# Patient Record
Sex: Female | Born: 1970
Health system: Southern US, Community
[De-identification: ages and names within clinical notes are randomized; demographics above are authoritative.]

## PROBLEM LIST (undated history)

## (undated) DIAGNOSIS — I639 Cerebral infarction, unspecified: Secondary | ICD-10-CM

## (undated) DIAGNOSIS — I10 Essential (primary) hypertension: Secondary | ICD-10-CM

## (undated) DIAGNOSIS — R569 Unspecified convulsions: Secondary | ICD-10-CM

## (undated) HISTORY — PX: OTHER SURGICAL HISTORY: SHX169

## (undated) HISTORY — PX: TONSILLECTOMY: SUR1361

---

## 2009-07-17 ENCOUNTER — Ambulatory Visit: Payer: Self-pay | Admitting: Diagnostic Radiology

## 2009-07-17 ENCOUNTER — Emergency Department (HOSPITAL_BASED_OUTPATIENT_CLINIC_OR_DEPARTMENT_OTHER): Admission: EM | Admit: 2009-07-17 | Discharge: 2009-07-17 | Payer: Self-pay | Admitting: Emergency Medicine

## 2009-07-19 ENCOUNTER — Emergency Department (HOSPITAL_BASED_OUTPATIENT_CLINIC_OR_DEPARTMENT_OTHER): Admission: EM | Admit: 2009-07-19 | Discharge: 2009-07-19 | Payer: Self-pay | Admitting: Emergency Medicine

## 2009-07-25 ENCOUNTER — Emergency Department (HOSPITAL_BASED_OUTPATIENT_CLINIC_OR_DEPARTMENT_OTHER): Admission: EM | Admit: 2009-07-25 | Discharge: 2009-07-26 | Payer: Self-pay | Admitting: Emergency Medicine

## 2010-07-17 ENCOUNTER — Emergency Department (HOSPITAL_BASED_OUTPATIENT_CLINIC_OR_DEPARTMENT_OTHER)
Admission: EM | Admit: 2010-07-17 | Discharge: 2010-07-17 | Payer: Self-pay | Source: Home / Self Care | Admitting: Emergency Medicine

## 2010-09-13 LAB — BASIC METABOLIC PANEL
CO2: 30 mEq/L (ref 19–32)
Creatinine, Ser: 0.6 mg/dL (ref 0.4–1.2)
Potassium: 3 mEq/L — ABNORMAL LOW (ref 3.5–5.1)
Sodium: 144 mEq/L (ref 135–145)

## 2010-09-13 LAB — GLUCOSE, CAPILLARY: Glucose-Capillary: 112 mg/dL — ABNORMAL HIGH (ref 70–99)

## 2011-09-14 ENCOUNTER — Encounter (HOSPITAL_BASED_OUTPATIENT_CLINIC_OR_DEPARTMENT_OTHER): Payer: Self-pay | Admitting: Emergency Medicine

## 2011-09-14 ENCOUNTER — Emergency Department (INDEPENDENT_AMBULATORY_CARE_PROVIDER_SITE_OTHER): Payer: No Typology Code available for payment source

## 2011-09-14 ENCOUNTER — Emergency Department (HOSPITAL_BASED_OUTPATIENT_CLINIC_OR_DEPARTMENT_OTHER)
Admission: EM | Admit: 2011-09-14 | Discharge: 2011-09-14 | Disposition: A | Discharge status: Home / Self Care | Payer: No Typology Code available for payment source | Attending: Emergency Medicine | Admitting: Emergency Medicine

## 2011-09-14 DIAGNOSIS — I10 Essential (primary) hypertension: Secondary | ICD-10-CM | POA: Insufficient documentation

## 2011-09-14 DIAGNOSIS — M549 Dorsalgia, unspecified: Secondary | ICD-10-CM

## 2011-09-14 DIAGNOSIS — S239XXA Sprain of unspecified parts of thorax, initial encounter: Secondary | ICD-10-CM | POA: Insufficient documentation

## 2011-09-14 DIAGNOSIS — E119 Type 2 diabetes mellitus without complications: Secondary | ICD-10-CM | POA: Insufficient documentation

## 2011-09-14 DIAGNOSIS — M546 Pain in thoracic spine: Secondary | ICD-10-CM | POA: Insufficient documentation

## 2011-09-14 DIAGNOSIS — IMO0002 Reserved for concepts with insufficient information to code with codable children: Secondary | ICD-10-CM

## 2011-09-14 HISTORY — DX: Essential (primary) hypertension: I10

## 2011-09-14 MED ORDER — CYCLOBENZAPRINE HCL 10 MG PO TABS: 10.0000 mg | ORAL_TABLET | Freq: Two times a day (BID) | ORAL | Status: AC | PRN
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
## 2011-09-14 NOTE — Discharge Instructions (Signed)
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
## 2012-03-04 ENCOUNTER — Emergency Department (HOSPITAL_BASED_OUTPATIENT_CLINIC_OR_DEPARTMENT_OTHER)
Admission: EM | Admit: 2012-03-04 | Discharge: 2012-03-04 | Disposition: A | Discharge status: Home / Self Care | Payer: Medicaid Other | Attending: Emergency Medicine | Admitting: Emergency Medicine

## 2012-03-04 ENCOUNTER — Encounter (HOSPITAL_BASED_OUTPATIENT_CLINIC_OR_DEPARTMENT_OTHER): Payer: Self-pay | Admitting: Emergency Medicine

## 2012-03-04 DIAGNOSIS — I10 Essential (primary) hypertension: Secondary | ICD-10-CM | POA: Insufficient documentation

## 2012-03-04 DIAGNOSIS — L02415 Cutaneous abscess of right lower limb: Secondary | ICD-10-CM

## 2012-03-04 DIAGNOSIS — E119 Type 2 diabetes mellitus without complications: Secondary | ICD-10-CM | POA: Insufficient documentation

## 2012-03-04 DIAGNOSIS — L02419 Cutaneous abscess of limb, unspecified: Secondary | ICD-10-CM | POA: Insufficient documentation

## 2012-03-04 MED ORDER — SULFAMETHOXAZOLE-TRIMETHOPRIM 800-160 MG PO TABS: 2.0000 | ORAL_TABLET | Freq: Two times a day (BID) | ORAL | Status: AC

## 2012-03-04 MED ORDER — CEPHALEXIN 500 MG PO CAPS: 500.0000 mg | ORAL_CAPSULE | Freq: Four times a day (QID) | ORAL | Status: AC

## 2012-03-04 NOTE — ED Provider Notes (Signed)
History     CSN: [id number]  Arrival date & time 03/04/12  0919   First MD Initiated Contact with Patient 03/04/12 1053      Chief Complaint  Patient presents with  . Cyst    (Consider location/radiation/quality/duration/timing/severity/associated sxs/prior treatment) The history is provided by the patient and medical records.    Sharon May is a 41 y.o. female presents to the emergency department complaining of R hip "lump" and pain.  The onset of the symptoms was  gradual starting 6 days ago.  The patient has associated swelling.  The symptoms have been  persistent, gradually worsened.  nothing makes the symptoms worse and nothing makes symptoms better.  The patient denies fever, chills, headache, chest pain, abdominal pain, nausea vomiting.  Pt states she has Hx of abscess with successful treatment using doxycyline.  .  The last one was approximately 1 year ago.  She states she used to get them frequently.  There has been no draining.  Pt states it is very sore and tender.  She has not tried anything.     Past Medical History  Diagnosis Date  . Hypertension   . Diabetes mellitus     Past Surgical History  Procedure Date  . Cyst removed   . Tonsillectomy     No family history on file.  History  Substance Use Topics  . Smoking status: Never Smoker   . Smokeless tobacco: Not on file  . Alcohol Use: No    OB History    Grav Para Term Preterm Abortions TAB SAB Ect Mult Living                  Review of Systems  Constitutional: Negative for fever, diaphoresis, appetite change, fatigue and unexpected weight change.  HENT: Negative for mouth sores and neck stiffness.   Eyes: Negative for visual disturbance.  Respiratory: Negative for cough, chest tightness, shortness of breath and wheezing.   Cardiovascular: Negative for chest pain.  Gastrointestinal: Negative for nausea, vomiting, abdominal pain, diarrhea and constipation.  Genitourinary: Negative for dysuria,  urgency, frequency and hematuria.  Skin: Negative for rash.       Lump on R hpi  Neurological: Negative for syncope, light-headedness and headaches.  Psychiatric/Behavioral: Negative for disturbed wake/sleep cycle. The patient is not nervous/anxious.     Allergies  Morphine and related  Home Medications   Current Outpatient Rx  Name Route Sig Dispense Refill  . METFORMIN HCL 500 MG PO TABS Oral Take 500 mg by mouth 2 (two) times daily with a meal.    . ATENOLOL 50 MG PO TABS Oral Take 50 mg by mouth daily.    . CEPHALEXIN 500 MG PO CAPS Oral Take 1 capsule (500 mg total) by mouth 4 (four) times daily. 40 capsule 0  . CLONIDINE HCL 0.2 MG PO TABS Oral Take 0.2 mg by mouth 2 (two) times daily.    Marland Kitchen HYDROCHLOROTHIAZIDE 25 MG PO TABS Oral Take 25 mg by mouth daily.    Marland Kitchen LISINOPRIL 20 MG PO TABS Oral Take 20 mg by mouth daily.    . SULFAMETHOXAZOLE-TRIMETHOPRIM 800-160 MG PO TABS Oral Take 2 tablets by mouth every 12 (twelve) hours. 40 tablet 0    BP 161/88  Pulse 87  Temp 97.9 F (36.6 C) (Oral)  Resp 17  SpO2 100%  Physical Exam  Nursing note and vitals reviewed. Constitutional: She appears well-developed and well-nourished. No distress.  HENT:  Head: Normocephalic and atraumatic.  Mouth/Throat:  Oropharynx is clear and moist. No oropharyngeal exudate.  Eyes: Conjunctivae are normal. No scleral icterus.  Neck: Normal range of motion. Neck supple.  Cardiovascular: Normal rate, regular rhythm, normal heart sounds and intact distal pulses.  Exam reveals no gallop and no friction rub.   No murmur heard. Pulmonary/Chest: Effort normal and breath sounds normal. No respiratory distress. She has no wheezes.  Abdominal: Soft. Bowel sounds are normal. She exhibits no mass. There is no tenderness. There is no rebound and no guarding.  Musculoskeletal: Normal range of motion. She exhibits no edema.  Neurological: She is alert.       Speech is clear and goal oriented Moves extremities  without ataxia  Skin: Skin is warm and dry. She is not diaphoretic.       Palpation of deep tissue swelling with discrete borders. No erythema or induration of the skin.   No increased warmth.    Psychiatric: She has a normal mood and affect.    ED Course  Procedures (including critical care time)  Labs Reviewed - No data to display No results found.   1. Abscess of hip, right       MDM  Mahealani Sulak presents with lump to the right hip.  History of abscess, likely deep tissue abscess. Patient is alert, oriented, nontoxic, nonseptic appearing. She is afebrile without systemic signs or symptoms. Masses to deep to open. Discharge home with Bactrim and Keflex. I've instructed her to followup with her primary care physician in the morning. If she is unable to make an appointment with that provider she is to call general surgery for evaluation.  I have also discussed reasons to return immediately to the ER.  Patient expresses understanding and agrees with plan.  1. Medications: bactrim, keflex 2. Treatment: Take medications as prescribed, warm compresses to the area 3. Follow Up: Primary care physician tomorrow morning. If unable to get an appointment call surgery at the number listed above.  Followup with your doctor in 48-72 hours. You may return to the emergency department if you have  a fever that persists greater than 101 or your abscess appears to become infected (growing surrounding redness and warmth). Do not operate any heavy machinery while on pain medications. Do not consume alcohol on these medications either.  Abscess An abscess (boil or furuncle) is an infected area that contains a collection of pus.  SYMPTOMS Signs and symptoms of an abscess include pain, tenderness, redness, or hardness. You may feel a moveable soft area under your skin. An abscess can occur anywhere in the body.  TREATMENT  A surgical cut (incision) may be made over your abscess to drain the pus. Gauze may  be packed into the space or a drain may be looped through the abscess cavity (pocket). This provides a drain that will allow the cavity to heal from the inside outwards. The abscess may be painful for a few days, but should feel much better if it was drained.  Your abscess, if seen early, may not have localized and may not have been drained. If not, another appointment may be required if it does not get better on its own or with medications. HOME CARE INSTRUCTIONS   Only take over-the-counter or prescription medicines for pain, discomfort, or fever as directed by your caregiver.   Take your antibiotics as directed if they were prescribed. Finish them even if you start to feel better.   Keep the skin and clothes clean around your abscess.   If  the abscess was drained, you will need to use gauze dressing to collect any draining pus. Dressings will typically need to be changed 3 or more times a day.   The infection may spread by skin contact with others. Avoid skin contact as much as possible.   Practice good hygiene. This includes regular hand washing, cover any draining skin lesions, and do not share personal care items.   If you participate in sports, do not share athletic equipment, towels, whirlpools, or personal care items. Shower after every practice or tournament.   If a draining area cannot be adequately covered:   Do not participate in sports.   Children should not participate in day care until the wound has healed or drainage stops.   If your caregiver has given you a follow-up appointment, it is very important to keep that appointment. Not keeping the appointment could result in a much worse infection, chronic or permanent injury, pain, and disability. If there is any problem keeping the appointment, you must call back to this facility for assistance.  SEEK MEDICAL CARE IF:   You develop increased pain, swelling, redness, drainage, or bleeding in the wound site.   You develop  signs of generalized infection including muscle aches, chills, fever, or a general ill feeling.   You have an oral temperature above 102 F (38.9 C).  MAKE SURE YOU:   Understand these instructions.   Will watch your condition.   Will get help right away if you are not doing well or get worse.  Document Released: 03/23/2005 Document Revised: 02/23/2011 Document Reviewed: 01/15/2008 [facility] Patient Information 2012 [place], [place].       Dahlia Client Jonanthony Nahar, PA-C 03/04/12 1254

## 2012-03-04 NOTE — ED Notes (Signed)
Pt has nodule on right hip that she noticed 2-3 days ago.  Pt denies fever, or insect bite.  Palpated nodule, painful to touch.  Pt relates it has gotten larger.

## 2012-03-10 NOTE — ED Provider Notes (Signed)
Medical screening examination/treatment/procedure(s) were performed by non-physician practitioner and as supervising physician I was immediately available for consultation/collaboration.  Jabarie Pop, MD 03/10/12 1433 

## 2012-09-12 ENCOUNTER — Emergency Department (HOSPITAL_BASED_OUTPATIENT_CLINIC_OR_DEPARTMENT_OTHER)
Admission: EM | Admit: 2012-09-12 | Discharge: 2012-09-12 | Disposition: A | Discharge status: Home / Self Care | Payer: Medicaid Other | Attending: Emergency Medicine | Admitting: Emergency Medicine

## 2012-09-12 ENCOUNTER — Encounter (HOSPITAL_BASED_OUTPATIENT_CLINIC_OR_DEPARTMENT_OTHER): Payer: Self-pay

## 2012-09-12 DIAGNOSIS — Z79899 Other long term (current) drug therapy: Secondary | ICD-10-CM | POA: Insufficient documentation

## 2012-09-12 DIAGNOSIS — I10 Essential (primary) hypertension: Secondary | ICD-10-CM | POA: Insufficient documentation

## 2012-09-12 DIAGNOSIS — IMO0002 Reserved for concepts with insufficient information to code with codable children: Secondary | ICD-10-CM | POA: Insufficient documentation

## 2012-09-12 DIAGNOSIS — E119 Type 2 diabetes mellitus without complications: Secondary | ICD-10-CM | POA: Insufficient documentation

## 2012-09-12 DIAGNOSIS — L02411 Cutaneous abscess of right axilla: Secondary | ICD-10-CM

## 2012-09-12 LAB — GLUCOSE, CAPILLARY: Glucose-Capillary: 340 mg/dL — ABNORMAL HIGH (ref 70–99)

## 2012-09-12 MED ORDER — SULFAMETHOXAZOLE-TRIMETHOPRIM 800-160 MG PO TABS: 1.0000 | ORAL_TABLET | Freq: Two times a day (BID) | ORAL | Status: AC

## 2012-09-12 MED ORDER — HYDROCODONE-ACETAMINOPHEN 5-325 MG PO TABS: 2.0000 | ORAL_TABLET | ORAL | Status: DC | PRN

## 2012-09-12 NOTE — ED Provider Notes (Signed)
History     CSN: [id number]  Arrival date & time 09/12/12  1536   First MD Initiated Contact with Patient 09/12/12 1548      Chief Complaint  Patient presents with  . Abscess    (Consider location/radiation/quality/duration/timing/severity/associated sxs/prior treatment) Patient is a 42 y.o. female presenting with abscess. The history is provided by the patient.  Abscess Location:  Shoulder/arm Shoulder/arm abscess location:  R axilla Abscess quality: draining, induration, painful and redness   Red streaking: no   Progression:  Worsening Pain details:    Quality:  No pain   Timing:  Constant Pt complains of an abscess to right arm  Past Medical History  Diagnosis Date  . Hypertension   . Diabetes mellitus     Past Surgical History  Procedure Laterality Date  . Cyst removed    . Tonsillectomy      No family history on file.  History  Substance Use Topics  . Smoking status: Never Smoker   . Smokeless tobacco: Not on file  . Alcohol Use: No    OB History   Grav Para Term Preterm Abortions TAB SAB Ect Mult Living                  Review of Systems  Skin: Positive for wound.  All other systems reviewed and are negative.    Allergies  Morphine and related  Home Medications   Current Outpatient Rx  Name  Route  Sig  Dispense  Refill  . atenolol (TENORMIN) 50 MG tablet   Oral   Take 50 mg by mouth daily.         . cloNIDine (CATAPRES) 0.2 MG tablet   Oral   Take 0.2 mg by mouth 2 (two) times daily.         . hydrochlorothiazide (HYDRODIURIL) 25 MG tablet   Oral   Take 25 mg by mouth daily.         Marland Kitchen lisinopril (PRINIVIL,ZESTRIL) 20 MG tablet   Oral   Take 20 mg by mouth daily.         . metFORMIN (GLUCOPHAGE) 500 MG tablet   Oral   Take 500 mg by mouth 2 (two) times daily with a meal.           BP 159/80  Pulse 88  Temp(Src) 98.6 F (37 C) (Oral)  Resp 18  Ht 5\' 4"  (1.626 m)  Wt 170 lb (77.111 kg)  BMI 29.17 kg/m2   SpO2 100%  Physical Exam  Nursing note and vitals reviewed. Constitutional: She is oriented to person, place, and time. She appears well-developed and well-nourished.  HENT:  Head: Normocephalic.  Right Ear: External ear normal.  Eyes: Pupils are equal, round, and reactive to light.  Neck: Normal range of motion.  Cardiovascular: Normal rate.   Pulmonary/Chest: Effort normal.  Abdominal: Soft.  Musculoskeletal: Normal range of motion.  Neurological: She is alert and oriented to person, place, and time.  Swollen right axilla  Skin: Skin is warm and dry.    ED Course  INCISION AND DRAINAGE Date/Time: 09/12/2012 5:42 PM Performed by: Cheron Schaumann K Authorized by: Cheron Schaumann K Risks and benefits: risks, benefits and alternatives were discussed Consent given by: patient Patient understanding: patient states understanding of the procedure being performed Required items: required blood products, implants, devices, and special equipment available Patient identity confirmed: verbally with patient Time out: Immediately prior to procedure a "time out" was called to verify the correct patient,  procedure, equipment, support staff and site/side marked as required. Type: abscess Body area: upper extremity Location details: right arm Anesthesia: local infiltration Local anesthetic: lidocaine 2% without epinephrine Scalpel size: 11 Incision type: elliptical and single straight Drainage: purulent Wound treatment: wound left open Comments: I also i and d smaller pointing abscess,   Areas appear to communicate,     (including critical care time)  Labs Reviewed  GLUCOSE, CAPILLARY - Abnormal; Notable for the following:    Glucose-Capillary 340 (*)    All other components within normal limits   No results found.   1. Abscess of right axilla       MDM  Bactrim and hydrocodone         Elson Areas, PA-C 09/12/12 1744

## 2012-09-12 NOTE — ED Notes (Signed)
PA at bedside for i&d

## 2012-09-12 NOTE — ED Notes (Signed)
Abscess to right axilla x 1 week

## 2012-09-13 NOTE — ED Provider Notes (Signed)
  Medical screening examination/treatment/procedure(s) were performed by non-physician practitioner and as supervising physician I was immediately available for consultation/collaboration.    Gerhard Munch, MD 09/13/12 [phone]

## 2013-11-03 ENCOUNTER — Encounter (HOSPITAL_BASED_OUTPATIENT_CLINIC_OR_DEPARTMENT_OTHER): Payer: Self-pay | Admitting: Emergency Medicine

## 2013-11-03 ENCOUNTER — Emergency Department (HOSPITAL_BASED_OUTPATIENT_CLINIC_OR_DEPARTMENT_OTHER)
Admission: EM | Admit: 2013-11-03 | Discharge: 2013-11-04 | Disposition: A | Discharge status: Home / Self Care | Payer: Medicaid Other | Attending: Emergency Medicine | Admitting: Emergency Medicine

## 2013-11-03 DIAGNOSIS — M545 Low back pain, unspecified: Secondary | ICD-10-CM

## 2013-11-03 DIAGNOSIS — I10 Essential (primary) hypertension: Secondary | ICD-10-CM

## 2013-11-03 DIAGNOSIS — E119 Type 2 diabetes mellitus without complications: Secondary | ICD-10-CM | POA: Insufficient documentation

## 2013-11-03 DIAGNOSIS — R52 Pain, unspecified: Secondary | ICD-10-CM | POA: Insufficient documentation

## 2013-11-03 DIAGNOSIS — Z79899 Other long term (current) drug therapy: Secondary | ICD-10-CM | POA: Insufficient documentation

## 2013-11-03 NOTE — ED Provider Notes (Addendum)
CSN: [id number]     Arrival date & time 11/03/13  2307 History  This chart was scribed for Hanley Seamen, MD by Nicholos Johns, ED scribe. This patient was seen in room MH09/MH09 and the patient's care was started at 11:53 PM.   Chief Complaint  Patient presents with  . Back Pain    The history is provided by the patient. No language interpreter was used.   HPI Comments: Deicy Hollar is a 43 y.o. female who presents to the Emergency Department complaining of gradually worsening right lower back pain, radiating to right buttock, onset about a week ago. Rates pain 10/10. Pain worse with ambulation and movement. There is no associated numbness or weakness. Pt was seen by her PCP 3 days ago for this pain and given muscle relaxant and an injection. Reports pain has since worsened. Pt is on Clonidine for BP but has not taken her evening dose yet. Denies HA, chest pain, SOB, blurred vision, or nausea.  Past Medical History  Diagnosis Date  . Hypertension   . Diabetes mellitus    Past Surgical History  Procedure Laterality Date  . Cyst removed    . Tonsillectomy     History reviewed. No pertinent family history. History  Substance Use Topics  . Smoking status: Never Smoker   . Smokeless tobacco: Not on file  . Alcohol Use: No   OB History   Grav Para Term Preterm Abortions TAB SAB Ect Mult Living                 Review of Systems  Eyes: Negative for visual disturbance.  Respiratory: Negative for shortness of breath.   Cardiovascular: Negative for chest pain.  Musculoskeletal: Positive for back pain.  Neurological: Negative for numbness and headaches.   A complete 10 system review of systems was obtained and all systems are negative except as noted in the HPI and PMH.   Allergies  Morphine and related  Home Medications   Prior to Admission medications   Medication Sig Start Date End Date Taking? Authorizing Provider  atenolol (TENORMIN) 50 MG tablet Take 50 mg by mouth  daily.   Yes Historical Provider, MD  cloNIDine (CATAPRES) 0.2 MG tablet Take 0.2 mg by mouth 2 (two) times daily.   Yes Historical Provider, MD  hydrochlorothiazide (HYDRODIURIL) 25 MG tablet Take 25 mg by mouth daily.   Yes Historical Provider, MD  lisinopril (PRINIVIL,ZESTRIL) 20 MG tablet Take 20 mg by mouth daily.   Yes Historical Provider, MD  metFORMIN (GLUCOPHAGE) 500 MG tablet Take 500 mg by mouth 2 (two) times daily with a meal.   Yes Historical Provider, MD  HYDROcodone-acetaminophen (NORCO/VICODIN) 5-325 MG per tablet Take 2 tablets by mouth every 4 (four) hours as needed for pain. 09/12/12   Elson Areas, PA-C   Triage Vitals: BP 241/120  Pulse 104  Temp(Src) 98.2 F (36.8 C) (Temporal)  Resp 21  Ht 5\' 4"  (1.626 m)  Wt 160 lb (72.576 kg)  BMI 27.45 kg/m2  SpO2 100%  Physical Exam  Nursing note and vitals reviewed.  General: Well-developed, well-nourished female in no acute distress; appearance consistent with age of record HENT: normocephalic; atraumatic Eyes: pupils equal, round and reactive to light; extraocular muscles intact Neck: supple Heart: regular rate and rhythm Lungs: clear to auscultation bilaterally Abdomen: soft; nondistended Extremities: No deformity; full range of motion except right hip limited by pain; pulses normal Neurologic: Awake, alert and oriented; motor function intact in all extremities  and symmetric; no facial droop Skin: Warm and dry Psychiatric: Normal mood and affect Back: Right paraspinal tenderness; pain on straight leg raise on the right at about 45 degrees; none on the left.  ED Course  Procedures (including critical care time) DIAGNOSTIC STUDIES: Oxygen Saturation is 100% on room air, normal by my interpretation.    COORDINATION OF CARE: At 12:00 AM: Discussed treatment plan with patient which includes dose of Clonidine and a pain injection. Patient agrees.    MDM  Patient was advised of her elevated blood pressure and need  to followup with her primary care physician for medication adjustment.   Final diagnoses:  Acute low back pain  Hypertension    I personally performed the services described in this documentation, which was scribed in my presence. The recorded information has been reviewed and is accurate.      Hanley SeamenJohn L Keysean Savino, MD 11/04/13 0013  Hanley SeamenJohn L Jonnell Hentges, MD 11/04/13 [phone]

## 2013-11-03 NOTE — ED Notes (Signed)
Pt reports lower back pain that started over a week ago - reports seen by her PCP Thursday and given muscle relaxer's and an injection - reports pain has worsened.

## 2013-11-04 MED ORDER — HYDROMORPHONE HCL PF 2 MG/ML IJ SOLN
2.0000 mg | Freq: Once | INTRAMUSCULAR | Status: AC
  Administered 2013-11-04: 2 mg via INTRAMUSCULAR
  Filled 2013-11-04: qty 1

## 2013-11-04 MED ORDER — PROMETHAZINE HCL 25 MG PO TABS: 25.0000 mg | ORAL_TABLET | Freq: Four times a day (QID) | ORAL | Status: DC | PRN

## 2013-11-04 MED ORDER — CLONIDINE HCL 0.1 MG PO TABS
0.2000 mg | ORAL_TABLET | Freq: Once | ORAL | Status: AC
  Administered 2013-11-04: 0.2 mg via ORAL
  Filled 2013-11-04: qty 2

## 2013-11-04 MED ORDER — PROMETHAZINE HCL 25 MG/ML IJ SOLN
INTRAMUSCULAR | Status: AC
  Filled 2013-11-04: qty 1

## 2013-11-04 MED ORDER — HYDROCODONE-ACETAMINOPHEN 10-325 MG PO TABS: 0.5000 | ORAL_TABLET | ORAL | Status: DC | PRN

## 2013-11-04 MED ORDER — PROMETHAZINE HCL 25 MG/ML IJ SOLN
25.0000 mg | Freq: Once | INTRAMUSCULAR | Status: AC
  Administered 2013-11-04: 25 mg via INTRAMUSCULAR

## 2013-11-04 MED ORDER — ONDANSETRON 4 MG PO TBDP
4.0000 mg | ORAL_TABLET | Freq: Once | ORAL | Status: AC
  Administered 2013-11-04: 4 mg via ORAL
  Filled 2013-11-04: qty 1

## 2013-11-04 NOTE — ED Notes (Signed)
MD aware of pts bp on DC.  Pt OK for DC and f/u with pmd tomorrow.

## 2013-11-04 NOTE — ED Notes (Signed)
MD at bedside. 

## 2014-03-14 ENCOUNTER — Encounter (HOSPITAL_BASED_OUTPATIENT_CLINIC_OR_DEPARTMENT_OTHER): Payer: Self-pay | Admitting: Emergency Medicine

## 2014-03-14 ENCOUNTER — Inpatient Hospital Stay (HOSPITAL_BASED_OUTPATIENT_CLINIC_OR_DEPARTMENT_OTHER)
Admission: EM | Admit: 2014-03-14 | Discharge: 2014-03-20 | DRG: 062 | Disposition: A | Discharge status: Rehabilitation Facility | Payer: Medicaid Other | Attending: Neurology | Admitting: Neurology

## 2014-03-14 ENCOUNTER — Emergency Department (HOSPITAL_BASED_OUTPATIENT_CLINIC_OR_DEPARTMENT_OTHER): Payer: Medicaid Other

## 2014-03-14 DIAGNOSIS — IMO0002 Reserved for concepts with insufficient information to code with codable children: Secondary | ICD-10-CM | POA: Diagnosis present

## 2014-03-14 DIAGNOSIS — Z9282 Status post administration of tPA (rtPA) in a different facility within the last 24 hours prior to admission to current facility: Secondary | ICD-10-CM

## 2014-03-14 DIAGNOSIS — I6340 Cerebral infarction due to embolism of unspecified cerebral artery: Principal | ICD-10-CM | POA: Diagnosis present

## 2014-03-14 DIAGNOSIS — I161 Hypertensive emergency: Secondary | ICD-10-CM

## 2014-03-14 DIAGNOSIS — R29898 Other symptoms and signs involving the musculoskeletal system: Secondary | ICD-10-CM

## 2014-03-14 DIAGNOSIS — IMO0001 Reserved for inherently not codable concepts without codable children: Secondary | ICD-10-CM | POA: Diagnosis present

## 2014-03-14 DIAGNOSIS — I10 Essential (primary) hypertension: Secondary | ICD-10-CM | POA: Diagnosis present

## 2014-03-14 DIAGNOSIS — H53469 Homonymous bilateral field defects, unspecified side: Secondary | ICD-10-CM | POA: Diagnosis present

## 2014-03-14 DIAGNOSIS — I2489 Other forms of acute ischemic heart disease: Secondary | ICD-10-CM | POA: Diagnosis present

## 2014-03-14 DIAGNOSIS — I428 Other cardiomyopathies: Secondary | ICD-10-CM | POA: Diagnosis present

## 2014-03-14 DIAGNOSIS — Z9119 Patient's noncompliance with other medical treatment and regimen: Secondary | ICD-10-CM

## 2014-03-14 DIAGNOSIS — B964 Proteus (mirabilis) (morganii) as the cause of diseases classified elsewhere: Secondary | ICD-10-CM | POA: Diagnosis present

## 2014-03-14 DIAGNOSIS — R414 Neurologic neglect syndrome: Secondary | ICD-10-CM | POA: Diagnosis present

## 2014-03-14 DIAGNOSIS — N390 Urinary tract infection, site not specified: Secondary | ICD-10-CM

## 2014-03-14 DIAGNOSIS — E1165 Type 2 diabetes mellitus with hyperglycemia: Secondary | ICD-10-CM | POA: Diagnosis present

## 2014-03-14 DIAGNOSIS — I498 Other specified cardiac arrhythmias: Secondary | ICD-10-CM | POA: Diagnosis not present

## 2014-03-14 DIAGNOSIS — I248 Other forms of acute ischemic heart disease: Secondary | ICD-10-CM

## 2014-03-14 DIAGNOSIS — R000 Tachycardia, unspecified: Secondary | ICD-10-CM | POA: Diagnosis present

## 2014-03-14 DIAGNOSIS — I639 Cerebral infarction, unspecified: Secondary | ICD-10-CM

## 2014-03-14 DIAGNOSIS — E785 Hyperlipidemia, unspecified: Secondary | ICD-10-CM | POA: Diagnosis present

## 2014-03-14 DIAGNOSIS — R7989 Other specified abnormal findings of blood chemistry: Secondary | ICD-10-CM

## 2014-03-14 DIAGNOSIS — T465X5A Adverse effect of other antihypertensive drugs, initial encounter: Secondary | ICD-10-CM | POA: Diagnosis not present

## 2014-03-14 DIAGNOSIS — Z91199 Patient's noncompliance with other medical treatment and regimen due to unspecified reason: Secondary | ICD-10-CM

## 2014-03-14 DIAGNOSIS — R778 Other specified abnormalities of plasma proteins: Secondary | ICD-10-CM

## 2014-03-14 LAB — DIFFERENTIAL
BASOS PCT: 0 % (ref 0–1)
Basophils Absolute: 0 10*3/uL (ref 0.0–0.1)
EOS ABS: 0.1 10*3/uL (ref 0.0–0.7)
Eosinophils Relative: 1 % (ref 0–5)
Lymphocytes Relative: 28 % (ref 12–46)
Lymphs Abs: 3.2 10*3/uL (ref 0.7–4.0)
MONO ABS: 0.8 10*3/uL (ref 0.1–1.0)
MONOS PCT: 7 % (ref 3–12)
Neutro Abs: 7.2 10*3/uL (ref 1.7–7.7)
Neutrophils Relative %: 64 % (ref 43–77)

## 2014-03-14 LAB — COMPREHENSIVE METABOLIC PANEL
ALT: 16 U/L (ref 0–35)
ANION GAP: 17 — AB (ref 5–15)
AST: 19 U/L (ref 0–37)
Albumin: 4.2 g/dL (ref 3.5–5.2)
Alkaline Phosphatase: 69 U/L (ref 39–117)
BILIRUBIN TOTAL: 0.3 mg/dL (ref 0.3–1.2)
BUN: 19 mg/dL (ref 6–23)
CHLORIDE: 98 meq/L (ref 96–112)
CO2: 24 mEq/L (ref 19–32)
CREATININE: 1.1 mg/dL (ref 0.50–1.10)
Calcium: 9.7 mg/dL (ref 8.4–10.5)
GFR calc Af Amer: 70 mL/min — ABNORMAL LOW (ref >90)
GFR calc non Af Amer: 61 mL/min — ABNORMAL LOW (ref >90)
Glucose, Bld: 236 mg/dL — ABNORMAL HIGH (ref 70–99)
Potassium: 3.1 mEq/L — ABNORMAL LOW (ref 3.7–5.3)
Sodium: 139 mEq/L (ref 137–147)
TOTAL PROTEIN: 8.7 g/dL — AB (ref 6.0–8.3)

## 2014-03-14 LAB — URINALYSIS, ROUTINE W REFLEX MICROSCOPIC
Bilirubin Urine: NEGATIVE
GLUCOSE, UA: 250 mg/dL — AB
Hgb urine dipstick: NEGATIVE
Ketones, ur: NEGATIVE mg/dL
Nitrite: NEGATIVE
PH: 7 (ref 5.0–8.0)
PROTEIN: 30 mg/dL — AB
Specific Gravity, Urine: 1.014 (ref 1.005–1.030)
Urobilinogen, UA: 0.2 mg/dL (ref 0.0–1.0)

## 2014-03-14 LAB — RAPID URINE DRUG SCREEN, HOSP PERFORMED
Amphetamines: NOT DETECTED
Barbiturates: NOT DETECTED
Benzodiazepines: NOT DETECTED
Cocaine: NOT DETECTED
OPIATES: NOT DETECTED
Tetrahydrocannabinol: NOT DETECTED

## 2014-03-14 LAB — URINE MICROSCOPIC-ADD ON

## 2014-03-14 LAB — CBC
HEMATOCRIT: 35.1 % — AB (ref 36.0–46.0)
Hemoglobin: 12.6 g/dL (ref 12.0–15.0)
MCH: 29.4 pg (ref 26.0–34.0)
MCHC: 35.9 g/dL (ref 30.0–36.0)
MCV: 81.8 fL (ref 78.0–100.0)
Platelets: 311 10*3/uL (ref 150–400)
RBC: 4.29 MIL/uL (ref 3.87–5.11)
RDW: 12.1 % (ref 11.5–15.5)
WBC: 11.3 10*3/uL — ABNORMAL HIGH (ref 4.0–10.5)

## 2014-03-14 LAB — CBG MONITORING, ED: GLUCOSE-CAPILLARY: 218 mg/dL — AB (ref 70–99)

## 2014-03-14 LAB — APTT: aPTT: 24 seconds (ref 24–37)

## 2014-03-14 LAB — ETHANOL: Alcohol, Ethyl (B): <11 mg/dL (ref 0–11)

## 2014-03-14 LAB — PROTIME-INR
INR: 0.97 (ref 0.00–1.49)
Prothrombin Time: 12.9 seconds (ref 11.6–15.2)

## 2014-03-14 LAB — TROPONIN I: TROPONIN I: 0.61 ng/mL — AB (ref <0.30)

## 2014-03-14 LAB — PREGNANCY, URINE: Preg Test, Ur: NEGATIVE

## 2014-03-14 MED ORDER — SODIUM CHLORIDE 0.9 % IV BOLUS (SEPSIS)
500.0000 mL | Freq: Once | INTRAVENOUS | Status: AC
  Administered 2014-03-14: 500 mL via INTRAVENOUS

## 2014-03-14 MED ORDER — NICARDIPINE HCL IN NACL 20-0.86 MG/200ML-% IV SOLN
5.0000 mg/h | Freq: Once | INTRAVENOUS | Status: AC
  Filled 2014-03-14 (×2): qty 200

## 2014-03-14 MED ORDER — NICARDIPINE HCL IN NACL 20-0.86 MG/200ML-% IV SOLN
INTRAVENOUS | Status: AC
  Filled 2014-03-14: qty 200

## 2014-03-14 NOTE — ED Notes (Signed)
Clydie Braun, RN, to assume care of patient at this time.

## 2014-03-14 NOTE — ED Notes (Signed)
Pending arrival from [facility] by [facility]. Pt has not arrived. Dr. Amada Jupiter (neuro) and RRT RN present.
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
## 2014-03-14 NOTE — ED Notes (Addendum)
Arrives to room, airway intact. Pt alert, NAD, calm, interactive, neuro and RRT RN at [facility]. Pt answering neuro MD questions. Some confusion reported. Cardene infusing/ continues.

## 2014-03-14 NOTE — ED Notes (Signed)
Pt c/o generalized weakness and numbness x 1 hr with n/v

## 2014-03-14 NOTE — ED Notes (Addendum)
Dr. Romeo Apple states to cancel [facility] and [facility] transport at this time.

## 2014-03-14 NOTE — ED Provider Notes (Signed)
CSN: [id number]     Arrival date & time 03/14/14  2102 History   This chart was scribed for Purvis Sheffield, MD, by Yevette Edwards, ED Scribe. This patient was seen in room MH09/MH09 and the patient's care was started at 9:29 PM.  First MD Initiated Contact with Patient 03/14/14 2112     Chief Complaint  Patient presents with  . Weakness    Patient is a 43 y.o. female presenting with weakness. The history is provided by the patient. No language interpreter was used.  Weakness The current episode started less than 1 hour ago. The problem occurs constantly. The problem has not changed since onset.Associated symptoms include headaches. Pertinent negatives include no chest pain, no abdominal pain and no shortness of breath. Nothing aggravates the symptoms. Nothing relieves the symptoms. She has tried nothing for the symptoms. The treatment provided no relief.   HPI Comments: Sharon May is a 43 y.o. female, with a h/o HTN and DM,  who presents to the Emergency Department complaining of tightness in her right foot and numbness sensation in her upper lip 1 hour ago. She has not had her HTN medication today; her fiance reports she is noncompliant with the medication. Sharon May reports the first symptom was swelling and numbness to her upper, right lip. Then she felt a tightness to her right foot; she denies numbness to the foot or leg. She also experienced a headache which has resolved. Her fiance states the pt seemed confused PTA. Upon arrival in the ED, Sharon May experienced nausea and emesis. She denies appetite changes. She also denies alcohol intake. Sharon May is a non-smoker.     Past Medical History  Diagnosis Date  . Hypertension   . Diabetes mellitus    Past Surgical History  Procedure Laterality Date  . Cyst removed    . Tonsillectomy     History reviewed. No pertinent family history. History  Substance Use Topics  . Smoking status: Never Smoker   . Smokeless tobacco: Not on  file  . Alcohol Use: No   No OB history provided.  Review of Systems  Constitutional: Negative for fever, appetite change and fatigue.  HENT: Positive for facial swelling. Negative for congestion and drooling.   Eyes: Negative for pain.  Respiratory: Negative for cough and shortness of breath.   Cardiovascular: Negative for chest pain.  Gastrointestinal: Positive for nausea and vomiting. Negative for abdominal pain and diarrhea.  Genitourinary: Negative for dysuria and hematuria.  Musculoskeletal: Negative for neck pain.  Skin: Negative for color change.  Neurological: Positive for weakness, numbness and headaches. Negative for dizziness.  Hematological: Negative for adenopathy.  Psychiatric/Behavioral: Positive for confusion. Negative for behavioral problems.  All other systems reviewed and are negative.   Allergies  Morphine and related  Home Medications   Prior to Admission medications   Medication Sig Start Date End Date Taking? Authorizing Provider  atenolol (TENORMIN) 50 MG tablet Take 50 mg by mouth daily.    Historical Provider, MD  cloNIDine (CATAPRES) 0.2 MG tablet Take 0.2 mg by mouth 2 (two) times daily.    Historical Provider, MD  hydrochlorothiazide (HYDRODIURIL) 25 MG tablet Take 25 mg by mouth daily.    Historical Provider, MD  HYDROcodone-acetaminophen (NORCO) 10-325 MG per tablet Take 0.5-1 tablets by mouth every 4 (four) hours as needed (for pain; may cause constipation). 11/04/13   Carlisle Beers Molpus, MD  HYDROcodone-acetaminophen (NORCO/VICODIN) 5-325 MG per tablet Take 2 tablets by mouth every 4 (four) hours  as needed for pain. 09/12/12   Elson Areas, PA-C  lisinopril (PRINIVIL,ZESTRIL) 20 MG tablet Take 20 mg by mouth daily.    Historical Provider, MD  metFORMIN (GLUCOPHAGE) 500 MG tablet Take 500 mg by mouth 2 (two) times daily with a meal.    Historical Provider, MD  promethazine (PHENERGAN) 25 MG tablet Take 1 tablet (25 mg total) by mouth every 6 (six) hours  as needed for nausea or vomiting. 11/04/13   Carlisle Beers Molpus, MD   Triage Vitals: BP 264/118  Pulse 104  Temp(Src) 97.9 F (36.6 C) (Oral)  Resp 16  Wt 160 lb (72.576 kg)  SpO2 100%  Physical Exam  Nursing note and vitals reviewed. Constitutional: She appears well-developed and well-nourished. No distress.  HENT:  Head: Atraumatic.  Mouth/Throat: Oropharynx is clear and moist. No oropharyngeal exudate.  Eyes: Conjunctivae and EOM are normal. Pupils are equal, round, and reactive to light. Right eye exhibits no discharge. Left eye exhibits no discharge. No scleral icterus.  Neck: Normal range of motion. Neck supple. No JVD present. No thyromegaly present.  Cardiovascular: Regular rhythm, normal heart sounds and intact distal pulses.  Exam reveals no gallop and no friction rub.   No murmur heard. HR 114  Pulmonary/Chest: Effort normal and breath sounds normal. No respiratory distress. She has no wheezes. She has no rales.  Abdominal: Soft. Bowel sounds are normal. She exhibits no distension and no mass. There is no tenderness.  Musculoskeletal: Normal range of motion. She exhibits no edema and no tenderness.  Lymphadenopathy:    She has no cervical adenopathy.  Neurological: She is alert. Coordination normal.  alert, oriented x1, thinks its 2016 and we are in [place] speech: normal in context and clarity memory: intact grossly cranial nerves II-XII: intact, no visual deficits noted motor strength: full proximally and distally no involuntary movements or tremors sensation: intact to light touch diffusely  cerebellar: heel to shin intact bilaterally, dysmetria in RUE w/ finger to nose, normal finger to nose in LUE gait: initially deferred d/t acuity to get pt to CT scan   Skin: Skin is warm and dry. No rash noted. No erythema.  Psychiatric: She has a normal mood and affect. Her behavior is normal.    ED Course  Procedures (including critical care time)  DIAGNOSTIC  STUDIES: Oxygen Saturation is 100% on room air, normal by my interpretation.    COORDINATION OF CARE:  9:47 PM- Discussed treatment plan with patient, and the patient agreed to the plan. The plan includes a CT scan and lab work.   Labs Review Labs Reviewed  CBC - Abnormal; Notable for the following:    WBC 11.3 (*)    HCT 35.1 (*)    All other components within normal limits  COMPREHENSIVE METABOLIC PANEL - Abnormal; Notable for the following:    Potassium 3.1 (*)    Glucose, Bld 236 (*)    Total Protein 8.7 (*)    GFR calc non Af Amer 61 (*)    GFR calc Af Amer 70 (*)    Anion gap 17 (*)    All other components within normal limits  URINALYSIS, ROUTINE W REFLEX MICROSCOPIC - Abnormal; Notable for the following:    APPearance CLOUDY (*)    Glucose, UA 250 (*)    Protein, ur 30 (*)    Leukocytes, UA LARGE (*)    All other components within normal limits  TROPONIN I - Abnormal; Notable for the following:  Troponin I 0.61 (*)    All other components within normal limits  URINE MICROSCOPIC-ADD ON - Abnormal; Notable for the following:    Squamous Epithelial / LPF FEW (*)    Bacteria, UA FEW (*)    All other components within normal limits  CBG MONITORING, ED - Abnormal; Notable for the following:    Glucose-Capillary 218 (*)    All other components within normal limits  ETHANOL  PROTIME-INR  APTT  DIFFERENTIAL  URINE RAPID DRUG SCREEN (HOSP PERFORMED)  PREGNANCY, URINE    Imaging Review Ct Head Wo Contrast  03/14/2014   CLINICAL DATA:  Weakness, numbness.  EXAM: CT HEAD WITHOUT CONTRAST  TECHNIQUE: Contiguous axial images were obtained from the base of the skull through the vertex without intravenous contrast.  COMPARISON:  None.  FINDINGS: No acute intracranial hemorrhage. No focal mass lesion. No CT evidence of acute infarction. No midline shift or mass effect. No hydrocephalus. Basilar cisterns are patent.  Paranasal sinuses and  mastoid air cells are clear.   IMPRESSION: Normal head CT.   Electronically Signed   By: Genevive Bi M.D.   On: 03/14/2014 22:12     EKG Interpretation   Date/Time:  Friday March 14 2014 21:51:45 EDT Ventricular Rate:  115 PR Interval:  154 QRS Duration: 96 QT Interval:  348 QTC Calculation: 481 R Axis:   -9 Text Interpretation:  Sinus tachycardia Left atrial enlargement Left  ventricular hypertrophy Cannot rule out Septal infarct , age undetermined  no previous for comparison Confirmed by Nolita Kutter  MD, Lorine Iannaccone ([id number]) on  03/14/2014 10:48:03 PM     CRITICAL CARE Performed by: Purvis Sheffield, S Total critical care time: 1 hour Critical care time was exclusive of separately billable procedures and treating other patients. Critical care was necessary to treat or prevent imminent or life-threatening deterioration. Critical care was time spent personally by me on the following activities: development of treatment plan with patient and/or surrogate as well as nursing, discussions with consultants, evaluation of patient's response to treatment, examination of patient, obtaining history from patient or surrogate, ordering and performing treatments and interventions, ordering and review of laboratory studies, ordering and review of radiographic studies, pulse oximetry and re-evaluation of patient's condition.  MDM   Final diagnoses:  Hypertensive emergency  Right arm weakness  Elevated troponin    9:50 PM 43 y.o. female w hx of HTN, DM who pw a tightness sensation in her right foot and a numbness sensation in her upper lip which started approximately one hour ago (8:30PM). She was found to have some dysmetria in her right upper extremity on my exam. I am unable to complete a NIHSS as she cannot read words without her glasses. However she does deny blurred vision. No visual deficits noted. She is hypertensive with a blood pressure to 264/118. She denies cp or sob. The differential includes CVA and also  hypertensive emergency. Will call a code stroke due to unusual neurologic symptoms in setting of high BP. Will start a cardene drip for her blood pressure.  Case discussed w/ Dr. Amada Jupiter. Given mild sx, will go ahead w/ CT and further workup here.   10:50 PM Pt having evolving sx, now w/ weakness in her RUE and drift in her RLE. She is also having some difficulty ambulating. I discussed the case w/ Dr. Amada Jupiter again. Given her evolving sx, will transfer to [facility] as code stroke for emergent eval by neurology. If not given tpa pt will likely need admission  for hypertensive emergency. Her disposition will depend on what neurology decides to do.      I personally performed the services described in this documentation, which was scribed in my presence. The recorded information has been reviewed and is accurate.    Purvis Sheffield, MD 03/15/14 1036

## 2014-03-14 NOTE — ED Notes (Signed)
While in CT-pt attempted to slide self to CT table-pt flopped over onto right side and was unable to hold self-assisted by RN x 2 and CT tech-EDP notified and is at pt BS

## 2014-03-14 NOTE — ED Notes (Signed)
Assumed care of patient.

## 2014-03-15 ENCOUNTER — Encounter (HOSPITAL_COMMUNITY): Payer: Self-pay

## 2014-03-15 ENCOUNTER — Emergency Department (HOSPITAL_COMMUNITY): Payer: Medicaid Other

## 2014-03-15 ENCOUNTER — Inpatient Hospital Stay (HOSPITAL_COMMUNITY): Payer: Medicaid Other

## 2014-03-15 DIAGNOSIS — R7989 Other specified abnormal findings of blood chemistry: Secondary | ICD-10-CM

## 2014-03-15 DIAGNOSIS — Z91199 Patient's noncompliance with other medical treatment and regimen due to unspecified reason: Secondary | ICD-10-CM | POA: Diagnosis not present

## 2014-03-15 DIAGNOSIS — I6340 Cerebral infarction due to embolism of unspecified cerebral artery: Secondary | ICD-10-CM | POA: Diagnosis present

## 2014-03-15 DIAGNOSIS — R414 Neurologic neglect syndrome: Secondary | ICD-10-CM | POA: Diagnosis present

## 2014-03-15 DIAGNOSIS — H53469 Homonymous bilateral field defects, unspecified side: Secondary | ICD-10-CM | POA: Diagnosis present

## 2014-03-15 DIAGNOSIS — T465X5A Adverse effect of other antihypertensive drugs, initial encounter: Secondary | ICD-10-CM | POA: Diagnosis not present

## 2014-03-15 DIAGNOSIS — IMO0001 Reserved for inherently not codable concepts without codable children: Secondary | ICD-10-CM | POA: Diagnosis present

## 2014-03-15 DIAGNOSIS — E785 Hyperlipidemia, unspecified: Secondary | ICD-10-CM | POA: Diagnosis present

## 2014-03-15 DIAGNOSIS — I6350 Cerebral infarction due to unspecified occlusion or stenosis of unspecified cerebral artery: Secondary | ICD-10-CM

## 2014-03-15 DIAGNOSIS — R29898 Other symptoms and signs involving the musculoskeletal system: Secondary | ICD-10-CM | POA: Diagnosis present

## 2014-03-15 DIAGNOSIS — I2489 Other forms of acute ischemic heart disease: Secondary | ICD-10-CM | POA: Diagnosis present

## 2014-03-15 DIAGNOSIS — I248 Other forms of acute ischemic heart disease: Secondary | ICD-10-CM | POA: Diagnosis present

## 2014-03-15 DIAGNOSIS — N390 Urinary tract infection, site not specified: Secondary | ICD-10-CM | POA: Diagnosis present

## 2014-03-15 DIAGNOSIS — B964 Proteus (mirabilis) (morganii) as the cause of diseases classified elsewhere: Secondary | ICD-10-CM | POA: Diagnosis present

## 2014-03-15 DIAGNOSIS — I10 Essential (primary) hypertension: Secondary | ICD-10-CM | POA: Diagnosis present

## 2014-03-15 DIAGNOSIS — I498 Other specified cardiac arrhythmias: Secondary | ICD-10-CM | POA: Diagnosis not present

## 2014-03-15 DIAGNOSIS — Z9282 Status post administration of tPA (rtPA) in a different facility within the last 24 hours prior to admission to current facility: Secondary | ICD-10-CM | POA: Diagnosis not present

## 2014-03-15 LAB — URINE MICROSCOPIC-ADD ON

## 2014-03-15 LAB — HEMOGLOBIN A1C
Hgb A1c MFr Bld: 8.7 % — ABNORMAL HIGH (ref <5.7)
Mean Plasma Glucose: 203 mg/dL — ABNORMAL HIGH (ref <117)

## 2014-03-15 LAB — LIPID PANEL
Cholesterol: 165 mg/dL (ref 0–200)
HDL: 35 mg/dL — ABNORMAL LOW (ref >39)
LDL CALC: 105 mg/dL — AB (ref 0–99)
Total CHOL/HDL Ratio: 4.7 RATIO
Triglycerides: 125 mg/dL (ref <150)
VLDL: 25 mg/dL (ref 0–40)

## 2014-03-15 LAB — URINALYSIS, ROUTINE W REFLEX MICROSCOPIC
Bilirubin Urine: NEGATIVE
Glucose, UA: >1000 mg/dL — AB
Hgb urine dipstick: NEGATIVE
KETONES UR: NEGATIVE mg/dL
LEUKOCYTES UA: NEGATIVE
NITRITE: NEGATIVE
Protein, ur: NEGATIVE mg/dL
SPECIFIC GRAVITY, URINE: 1.015 (ref 1.005–1.030)
UROBILINOGEN UA: 0.2 mg/dL (ref 0.0–1.0)
pH: 7.5 (ref 5.0–8.0)

## 2014-03-15 LAB — TROPONIN I
TROPONIN I: 1.42 ng/mL — AB (ref <0.30)
Troponin I: 0.42 ng/mL (ref <0.30)
Troponin I: 1.17 ng/mL (ref <0.30)

## 2014-03-15 LAB — GLUCOSE, CAPILLARY
Glucose-Capillary: 223 mg/dL — ABNORMAL HIGH (ref 70–99)
Glucose-Capillary: 175 mg/dL — ABNORMAL HIGH (ref 70–99)
Glucose-Capillary: 213 mg/dL — ABNORMAL HIGH (ref 70–99)
Glucose-Capillary: 173 mg/dL — ABNORMAL HIGH (ref 70–99)

## 2014-03-15 LAB — MRSA PCR SCREENING: MRSA by PCR: NEGATIVE

## 2014-03-15 MED ORDER — ALTEPLASE (STROKE) FULL DOSE INFUSION
0.9000 mg/kg | Freq: Once | INTRAVENOUS | Status: AC
  Administered 2014-03-15: 58 mg via INTRAVENOUS
  Filled 2014-03-15: qty 65

## 2014-03-15 MED ORDER — PANTOPRAZOLE SODIUM 40 MG IV SOLR
40.0000 mg | Freq: Every day | INTRAVENOUS | Status: DC
  Administered 2014-03-15 – 2014-03-19 (×6): 40 mg via INTRAVENOUS
  Filled 2014-03-15 (×7): qty 40

## 2014-03-15 MED ORDER — ASPIRIN 325 MG PO TABS: 325.0000 mg | ORAL_TABLET | Freq: Every day | ORAL | Status: DC

## 2014-03-15 MED ORDER — CLONIDINE HCL 0.1 MG PO TABS
0.1000 mg | ORAL_TABLET | Freq: Two times a day (BID) | ORAL | Status: DC
  Filled 2014-03-15: qty 1

## 2014-03-15 MED ORDER — INSULIN ASPART 100 UNIT/ML ~~LOC~~ SOLN
0.0000 [IU] | Freq: Three times a day (TID) | SUBCUTANEOUS | Status: DC
  Administered 2014-03-15 (×2): 5 [IU] via SUBCUTANEOUS
  Administered 2014-03-15 – 2014-03-18 (×8): 3 [IU] via SUBCUTANEOUS
  Administered 2014-03-19 (×3): 2 [IU] via SUBCUTANEOUS
  Administered 2014-03-20 (×2): 3 [IU] via SUBCUTANEOUS

## 2014-03-15 MED ORDER — SODIUM CHLORIDE 0.9 % IV SOLN
INTRAVENOUS | Status: DC
  Administered 2014-03-15: 75 mL/h via INTRAVENOUS
  Administered 2014-03-15 – 2014-03-16 (×2): via INTRAVENOUS

## 2014-03-15 MED ORDER — ASPIRIN 325 MG PO TABS
325.0000 mg | ORAL_TABLET | Freq: Once | ORAL | Status: AC
  Administered 2014-03-15: 325 mg via ORAL
  Filled 2014-03-15: qty 1

## 2014-03-15 MED ORDER — NICARDIPINE HCL IN NACL 20-0.86 MG/200ML-% IV SOLN
3.0000 mg/h | INTRAVENOUS | Status: DC
  Administered 2014-03-15 (×2): 5 mg/h via INTRAVENOUS
  Filled 2014-03-15 (×2): qty 200

## 2014-03-15 MED ORDER — IOHEXOL 350 MG/ML SOLN
50.0000 mL | Freq: Once | INTRAVENOUS | Status: AC | PRN
  Administered 2014-03-15: 50 mL via INTRAVENOUS

## 2014-03-15 MED ORDER — ATORVASTATIN CALCIUM 10 MG PO TABS
20.0000 mg | ORAL_TABLET | Freq: Every day | ORAL | Status: DC
  Administered 2014-03-15 – 2014-03-19 (×5): 20 mg via ORAL
  Filled 2014-03-15: qty 1
  Filled 2014-03-15: qty 2
  Filled 2014-03-15 (×2): qty 1
  Filled 2014-03-15: qty 2

## 2014-03-15 MED ORDER — WHITE PETROLATUM GEL
Status: AC
  Administered 2014-03-15: 0.2
  Filled 2014-03-15: qty 5

## 2014-03-15 MED ORDER — LABETALOL HCL 5 MG/ML IV SOLN
10.0000 mg | Freq: Once | INTRAVENOUS | Status: AC
  Administered 2014-03-15: 10 mg via INTRAVENOUS

## 2014-03-15 MED ORDER — NICARDIPINE HCL IN NACL 20-0.86 MG/200ML-% IV SOLN
INTRAVENOUS | Status: AC
  Filled 2014-03-15: qty 200

## 2014-03-15 MED ORDER — ACETAMINOPHEN 325 MG PO TABS
650.0000 mg | ORAL_TABLET | ORAL | Status: DC | PRN
  Administered 2014-03-16 – 2014-03-19 (×5): 650 mg via ORAL
  Filled 2014-03-15 (×6): qty 2

## 2014-03-15 MED ORDER — ACETAMINOPHEN 650 MG RE SUPP: 650.0000 mg | RECTAL | Status: DC | PRN

## 2014-03-15 MED ORDER — LABETALOL HCL 5 MG/ML IV SOLN
10.0000 mg | INTRAVENOUS | Status: DC | PRN
  Administered 2014-03-15 – 2014-03-20 (×10): 10 mg via INTRAVENOUS
  Filled 2014-03-15 (×11): qty 4

## 2014-03-15 MED ORDER — STROKE: EARLY STAGES OF RECOVERY BOOK
Freq: Once | Status: AC
  Administered 2014-03-15: 03:00:00
  Filled 2014-03-15: qty 1

## 2014-03-15 MED ORDER — SENNOSIDES-DOCUSATE SODIUM 8.6-50 MG PO TABS
1.0000 | ORAL_TABLET | Freq: Every evening | ORAL | Status: DC | PRN
  Filled 2014-03-15: qty 1

## 2014-03-15 MED ORDER — SODIUM CHLORIDE 0.9 % IV SOLN
250.0000 mL | Freq: Once | INTRAVENOUS | Status: AC
  Administered 2014-03-15: 250 mL via INTRAVENOUS

## 2014-03-15 NOTE — Progress Notes (Signed)
43 yo CODE STROKE arrived at [facility] at 2110 complaining of swelling/numbness right upper lip, tightness in right foot and mild confusion per ED provider note. Last seen normal at 2030. HTN treated with Cardene gtt and original code stroke called at 2151. CBG 218. CT scan and labs done at [facility], CT scan negative. At 2250 per ED Provider note pt developed evolving symptoms including right arm weakness, drift in right leg and trouble ambulating. Code Stroke reactivated at 2249. Pt brought to [facility] ED per EMS and arrived at 2350. NIHSS done yielding 10, see stroke assessment flow sheet for details. Repeat CT done, new second IV started, FC placed. After long discussion with family per Dr. Amada Jupiter at bedside TPA was agreed to TPA started at 0020. CTA done at 0050. Some improvement noted at 0050 in right arm weakness. Pt for admit to 80M, report called per ED RN at 0124.

## 2014-03-15 NOTE — Progress Notes (Signed)
Pt is more lethargic this evening, drift seems more prominent on the right side. Dr. Lucia Gaskins reassessed pt and feels that it is due to fatigue. Will continue to monitor and update as needed.

## 2014-03-15 NOTE — Progress Notes (Signed)
CRITICAL VALUE ALERT  Critical value received: troponin 0.42  Date of notification:  03/15/14  Time of notification:  0327  Critical value read back:Yes.    Nurse who received alert:  Tedra Coupe, RN  MD notified (1st page):  Amada Jupiter  Time of first page:  0340  MD notified (2nd page):  Time of second page:  Responding MD: Amada Jupiter  Time MD responded: [phone]

## 2014-03-15 NOTE — ED Notes (Signed)
CTA finished.

## 2014-03-15 NOTE — ED Notes (Signed)
Delays cancelled, preparing to go back to ED.

## 2014-03-15 NOTE — Progress Notes (Signed)
Dr. Amada Jupiter in to check pt. EKG ordered and carried out.

## 2014-03-15 NOTE — H&P (Signed)
Neurology H&P  CC: Right arm weakness  History is obtained from: Patient  HPI: Sharon May is a 43 y.o. female with a history of hypertension, diabetes who presents with right arm weakness that started earlier tonight. When she initially arrived to [facility] this complaint, she had very mild symptoms with some mild discoordination of her right hand, but otherwise no symptoms including visual problems.   A code stroke was activated, but given that she did not have significantly disabling symptoms initially, she was getting a head CT, etc at [facility] in anticipation of admittion. During workup, however, she had progressive worsening, and therefore a code stroke was re-activated and she was transported to [facility] to receive tPA.   Here, it was noted that she also had developed a hemianopia and left gaze preference.   LKW: 8:30 PM tpa given?: yes   ROS: A 14 point ROS was performed and is negative except as noted in the HPI.   Past Medical History  Diagnosis Date  . Hypertension   . Diabetes mellitus     Social History: Tob: Denies  Exam: Current vital signs: BP 168/84  Pulse 121  Temp(Src) 97.9 F (36.6 C) (Oral)  Resp 17  Ht 5' 4.17" (1.63 m)  Wt 72.576 kg (160 lb)  BMI 27.32 kg/m2  SpO2 100% Vital signs in last 24 hours: Temp:  [97.9 F (36.6 C)] 97.9 F (36.6 C) (09/18 2200) Pulse Rate:  [104-148] 121 (09/19 0140) Resp:  [12-41] 17 (09/19 0140) BP: (154-264)/(70-183) 168/84 mmHg (09/19 0140) SpO2:  [98 %-100 %] 100 % (09/19 0140) Weight:  [72.576 kg (160 lb)] 72.576 kg (160 lb) (09/18 2108)  General: In bed, NAD CV: Tachycardic Mental Status: Patient is awake, alert, oriented to person, place,  Year. She appears confused, repeatedly asking " So you're saying I'm sick?" after being told her condition.  She is able to repeat without difficulty. Able to name simple objects.  No signs of aphasia. She does have some right sided neglect including extinction to  DSS.  Cranial Nerves: II: Right hemianopia. Pupils are equal, round, and reactive to light.  Discs are difficult to visualize. III,IV, VI: able to cross midline to right, but does have left gaze preference.  V: Facial sensation is decreased on the right.  VII: Facial movement is symmetric.  VIII: hearing is intact to voice X: Uvula elevates symmetrically XI: Shoulder shrug is symmetric. XII: tongue is midline without atrophy or fasciculations.  Motor: Tone is normal. Bulk is normal. 5/5 strength was present on the left side, she has 2/5 strength in the right arm, 4/5 in the right leg.  Sensory: Sensation is diminished on the right, though patient does not always endorse it due to neglect.  Deep Tendon Reflexes: 2+ and symmetric in the biceps and patellae.  Plantars: Toes are downgoing bilaterally.  Cerebellar: Not assessed due to acute nature of evaluation and multiple medical monitors in ED setting. Gait: Not assessed due to acute nature of evaluation and multiple medical monitors in ED setting.   I have reviewed labs in epic and the results pertinent to this consultation are: Mildly elevated troponin  I have reviewed the images obtained:CT head - negative  Impression: 43 yo F with likley left sided stroke, with weakness and neglect, likely posterior MCA distribution. Will get CTA to rule out large vessel occlusion and admit to Neuro ICU.  I suspect that her tachycardia is a reflex tachycardia associated with cardene, will titrate down and  use labetalol pushes for BP control.    Plan: 1. CTA head and neck to rule out large vessel occlusion  2. MRI  of the brain without contrast 3. Frequent neuro checks 4. Echocardiogram 5. Prophylactic therapy-Antiplatelet med: none for 24 hours 6. Risk factor modification 7. Telemetry monitoring 8. PT consult, OT consult, Speech consult 9. elevated troponin- likely due to very high BP and tachycardia in the setting of acute stroke - no EKG  changes or symptoms to suggest cardiac ischemia. Will trend x 3.  10. SSI for DM 11.HgbA1c, fasting lipid panel 12. Decrease cardene and control BP with labetalol.    This patient is critically ill and at significant risk of neurological worsening, death and care requires constant monitoring of vital signs, hemodynamics,respiratory and cardiac monitoring, neurological assessment, discussion with family, other specialists and medical decision making of high complexity. I spent 60 minutes of neurocritical care time  in the care of  this patient.  Ritta Slot, MD [facility] [phone]  If 7pm- 7am, please page neurology on call as listed in AMION. 03/15/2014  2:45 AM

## 2014-03-15 NOTE — ED Notes (Signed)
No changes from arrival. R arm weakness and neglect present. On CT table. Preparing for CTA. Dr. Amada Jupiter and RRT RN at [facility]. Pt remains alert, NAD, calm, interactive, speech clear, answering questions appropriately, cardene and tPA infusing, labetolol given. HR down to 117 from 133, ST.

## 2014-03-15 NOTE — ED Notes (Signed)
No changes. No signs of bleeding. Tolerating tPA and CTA. VSS/ improving.

## 2014-03-15 NOTE — Progress Notes (Signed)
STROKE TEAM PROGRESS NOTE   HISTORY Sharon May is a 43 y.o. female with a history of hypertension, diabetes who presents with right arm weakness that started earlier tonight. When she initially arrived to [facility] this complaint, she had very mild symptoms with some mild discoordination of her right hand, but otherwise no symptoms including visual problems. A code stroke was activated, but given that she did not have significantly disabling symptoms initially, she was getting a head CT, etc at [facility] in anticipation of admittion. During workup, however, she had progressive worsening, and therefore a code stroke was re-activated and she was transported to [facility] to receive tPA.  Here, it was noted that she also had developed a hemianopia and left gaze preference.  LKW: 8:30 PM  tpa given?: yes   She was admitted to the neuro ICU for further evaluation and treatment.   SUBJECTIVE (INTERVAL HISTORY) Her exam is at the bedside.  She is a poor historian. She says her boyfriend brought her to the ED because she was vomiting and the hospital must have noticed that she was weak. No family at bedside. She said she did not notice any weakness in the ED but did feel like she was talking slowly. She reports her vision has always been bad. She does say that her right arm feels funny now but that her speech is back to normal.    OBJECTIVE Temp:  [98.2 F (36.8 C)-99.1 F (37.3 C)] 99.1 F (37.3 C) (09/19 2000) Pulse Rate:  [89-148] 104 (09/19 2100) Cardiac Rhythm:  [-] Sinus tachycardia (09/19 2000) Resp:  [12-41] 29 (09/19 2100) BP: (139-250)/(58-183) 159/83 mmHg (09/19 2100) SpO2:  [95 %-100 %] 100 % (09/19 2100)   Recent Labs Lab 03/14/14 2144 03/15/14 0901 03/15/14 1208 03/15/14 1705 03/15/14 2132  GLUCAP 218* 223* 175* 213* 173*    Recent Labs Lab 03/14/14 2137  NA 139  K 3.1*  CL 98  CO2 24  GLUCOSE 236*  BUN 19  CREATININE 1.10  CALCIUM 9.7    Recent Labs Lab  03/14/14 2137  AST 19  ALT 16  ALKPHOS 69  BILITOT 0.3  PROT 8.7*  ALBUMIN 4.2    Recent Labs Lab 03/14/14 2137  WBC 11.3*  NEUTROABS 7.2  HGB 12.6  HCT 35.1*  MCV 81.8  PLT 311    Recent Labs Lab 03/14/14 2137 03/15/14 0248 03/15/14 0956 03/15/14 1713  TROPONINI 0.61* 0.42* 1.17* 1.42*    Recent Labs  03/14/14 2137  LABPROT 12.9  INR 0.97    Recent Labs  03/14/14 2214 03/15/14 0136  COLORURINE YELLOW STRAW*  LABSPEC 1.014 1.015  PHURINE 7.0 7.5  GLUCOSEU 250* >1000*  HGBUR NEGATIVE NEGATIVE  BILIRUBINUR NEGATIVE NEGATIVE  KETONESUR NEGATIVE NEGATIVE  PROTEINUR 30* NEGATIVE  UROBILINOGEN 0.2 0.2  NITRITE NEGATIVE NEGATIVE  LEUKOCYTESUR LARGE* NEGATIVE       Component Value Date/Time   CHOL 165 03/15/2014 0249   TRIG 125 03/15/2014 0249   HDL 35* 03/15/2014 0249   CHOLHDL 4.7 03/15/2014 0249   VLDL 25 03/15/2014 0249   LDLCALC 105* 03/15/2014 0249   Lab Results  Component Value Date   HGBA1C 8.7* 03/15/2014      Component Value Date/Time   LABOPIA NONE DETECTED 03/14/2014 2215   COCAINSCRNUR NONE DETECTED 03/14/2014 2215   LABBENZ NONE DETECTED 03/14/2014 2215   AMPHETMU NONE DETECTED 03/14/2014 2215   THCU NONE DETECTED 03/14/2014 2215   LABBARB NONE DETECTED 03/14/2014 2215  Recent Labs Lab 03/14/14 2137  ETH <11    Ct Angio Head W/cm &/or Wo Cm  03/15/2014  IMPRESSION: CTA HEAD: Mild luminal irregularity of the posterior circulation suggests atherosclerosis without hemodynamically significant stenosis nor acute vascular process.  If ongoing concern for acute ischemia, MRI of the brain with diffusion-weighted sequences would be more sensitive.  CTA NECK: No hemodynamically significant stenosis nor acute vascular process.     Ct Head Wo Contrast  03/15/2014   IMPRESSION: 1. No acute intracranial pathology seen on CT. 2. Mild small vessel ischemic microangiopathy.   Ct Head Wo Contrast  03/14/2014    IMPRESSION: Normal head CT.     Ct  Angio Neck W/cm &/or Wo/cm  03/15/2014    IMPRESSION: CTA HEAD: Mild luminal irregularity of the posterior circulation suggests atherosclerosis without hemodynamically significant stenosis nor acute vascular process.  If ongoing concern for acute ischemia, MRI of the brain with diffusion-weighted sequences would be more sensitive.  CTA NECK: No hemodynamically significant stenosis nor acute vascular process.    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 Swelling but no evidence of hemorrhage or shift.   Electronically Signed   By: Paulina Fusi M.D.   On: 03/15/2014 15:49    PHYSICAL EXAM Physical exam: Exam: Gen: NAD Eyes: anicteric sclerae, moist conjunctivae                    CV: RRR, no MRG Mental Status: Alert, Not following commands  Neuro: Detailed Neurologic Exam  Speech:    No dysarthria, no aphasia  Cranial Nerves:    The pupils are equal, round, and reactive to light. EOMI. Face symmetric. Right hemianopia. Conjugate gaze. No gaze preference.   Motor Observation:    no involuntary movements noted.    Strength:    Decreased strength right arm 2/5. Able to move all other extremities anti gravity but right leg appears weaker than left.   Reflexes: brisk throughout  Toes: downgoing     Cortical Sensory Modalities:     Right-sided neglect     ASSESSMENT/PLAN  Sharon May is a 43 y.o. female with a history of hypertension, diabetes who presents with right arm weakness last evening.  She did receive IV t-PA . Imaging confirms acute infarction affecting the complete left posterior cerebral artery  Stroke work up underway.  Stroke  ASA 325  Carotid Doppler pending  2D Echo  pending  LDL statin necessary as meeting goal of LDL <70 (diabetes)  HgbA1c 8.7  SCDs for VTE prophylaxis  Bedrest  Therapy needs:  PT/OT and speech  Disposition:   pending  Hypertension   Post TPA <180/105 for 24-48 hours and then gradually normalize within 5-7 days.  BP goal long term 130/80  Hyperlipidemia  LDL 105  LDL goal <70 for diabetics  Start Lipitor 20mg  qday  Diabetes  Home meds: Metformin  HgbA1c 8.7  Uncontrolled  Goal < 7.0  Will need to educate patient about lifestyle changes for diabetes treatment  Other Active Problems  Elevated Troponin I 0.61 -> 0.42 -> 1.17 -> 1.42. Cardiology consulted.  Hospital day # 1   Reviewed images CT of the head , MRI of the brain.  Artemio Aly, MD Neuro Stroke   To contact Stroke Continuity provider, please refer to [email or web address]. After hours, contact General Neurology

## 2014-03-15 NOTE — Progress Notes (Signed)
eLink Physician-Brief Progress Note Patient Name: Sharon May DOB: [birth year] MRN: [id number]   Date of Service  03/15/2014  HPI/Events of Note  eMD assessment  eICU Interventions  Stroke s/p TPA, stable, on cardene gtt for hypertension.        Dhyana Bastone K. 03/15/2014, 2:46 AM

## 2014-03-15 NOTE — ED Notes (Signed)
Family at [facility] x3. No signs of bleeding. Report called. Remains alert, NAD, calm, interactive, resps e/u, speaking in clear complete sentences. VSS.

## 2014-03-15 NOTE — ED Notes (Signed)
Back in room Trauma C in ED.

## 2014-03-15 NOTE — ED Notes (Signed)
Pt in CT, neuro MD present, no changes, tPA ordered, verbally consented.

## 2014-03-15 NOTE — Consult Note (Addendum)
Admit date: 03/14/2014 Referring Physician  Dr. Amada Jupiter Primary Cardiologist  NONE Reason for Consultation  Abnormal EKG with elevated troponin in the setting of CVA  HPI: Sharon May is a 43 y.o. female, with a h/o HTN and DM, who presented to the Emergency Department at [facility] med center on 9/18 complaining of tightness in her right foot and numbness sensation in her upper lip for 1 hour.. She had not had her HTN medication that day; her fiance reported that she is noncompliant with the medication. [name] reported the first symptom was swelling and numbness to her upper, right lip. Then she felt a tightness to her right foot; she denied numbness to the foot or leg. She also experienced a headache which  resolved. Her fiance states the pt seemed confused PTA. Upon arrival in the ED, [name] experienced nausea and emesis. Her BP was markedly elevated at the time at 264/191mmHg.  She denied any chest pain or SOB.  A code stroke was called but due to mild symptoms it was cancelled and head CT was done.  She then had evolution of her symptoms with weakness in her RUE and RLE with difficulty ambulating and code stroke was reinitiated and she was transferred to [facility].  She was started on a Cardene gtt and labetolol to control her BP.  She developed some sinus tachycardia which primary service felt was secondary to reflex tachycardia fro the Cardene and this was decreased and she was given Labetolol. She was also given tPA.  EKG on admission showed sinus tachycardia with nonspecific T wave abnormality in the lateral leads.  She was noted to have an elevated trop of 0.6 on admission that was felt to be due to demand ischemia from her HTN as well as CVA.  The troponin decreased to 0.42 and then increased again to 1.42.  She denies any chest pain.  Cardiology is now asked to consult.      PMH:   Past Medical History  Diagnosis Date  . Hypertension   . Diabetes mellitus      PSH:   Past Surgical  History  Procedure Laterality Date  . Cyst removed    . Tonsillectomy      Allergies:  Morphine and related Prior to Admit Meds:   Prescriptions prior to admission  Medication Sig Dispense Refill  . atenolol (TENORMIN) 50 MG tablet Take 50 mg by mouth daily.      . cloNIDine (CATAPRES) 0.2 MG tablet Take 0.2 mg by mouth 2 (two) times daily.      . hydrochlorothiazide (HYDRODIURIL) 25 MG tablet Take 25 mg by mouth daily.      Marland Kitchen HYDROcodone-acetaminophen (NORCO) 10-325 MG per tablet Take 0.5-1 tablets by mouth every 4 (four) hours as needed (for pain; may cause constipation).  30 tablet  0  . lisinopril (PRINIVIL,ZESTRIL) 20 MG tablet Take 20 mg by mouth daily.      . metFORMIN (GLUCOPHAGE) 500 MG tablet Take 500 mg by mouth 2 (two) times daily with a meal.      . promethazine (PHENERGAN) 25 MG tablet Take 1 tablet (25 mg total) by mouth every 6 (six) hours as needed for nausea or vomiting.  20 tablet  0   Fam HX:   History reviewed. No pertinent family history. Social HX:    History   Social History  . Marital Status: Single    Spouse Name: N/A    Number of Children: N/A  . Years  of Education: N/A   Occupational History  . Not on file.   Social History Main Topics  . Smoking status: Never Smoker   . Smokeless tobacco: Not on file  . Alcohol Use: No  . Drug Use: No  . Sexual Activity: Not on file   Other Topics Concern  . Not on file   Social History Narrative  . No narrative on file     ROS:  All 11 ROS were addressed and are negative except what is stated in the HPI  Physical Exam: Blood pressure 157/79, pulse 101, temperature 99.5 F (37.5 C), temperature source Oral, resp. rate 28, height 5' 4.17" (1.63 m), weight 160 lb (72.576 kg), SpO2 99.00%.    General: Well developed, well nourished, in no acute distress Head: Eyes PERRLA, No xanthomas.   Normal cephalic and atramatic  Lungs:   Clear bilaterally to auscultation and percussion. Heart:   HRRR S1 S2 Pulses  are 2+ & equal.            No carotid bruit. No JVD.  No abdominal bruits. No femoral bruits. Abdomen: Bowel sounds are positive, abdomen soft and non-tender without masses  Extremities:   No clubbing, cyanosis or edema.  DP +1 Neuro: Alert and oriented X 3. Psych:  Good affect, responds appropriately    Labs:   Lab Results  Component Value Date   WBC 11.3* 03/14/2014   HGB 12.6 03/14/2014   HCT 35.1* 03/14/2014   MCV 81.8 03/14/2014   PLT 311 03/14/2014    Recent Labs Lab 03/14/14 2137  NA 139  K 3.1*  CL 98  CO2 24  BUN 19  CREATININE 1.10  CALCIUM 9.7  PROT 8.7*  BILITOT 0.3  ALKPHOS 69  ALT 16  AST 19  GLUCOSE 236*   No results found for this basename: PTT   Lab Results  Component Value Date   INR 0.97 03/14/2014   Lab Results  Component Value Date   TROPONINI 1.42* 03/15/2014     Lab Results  Component Value Date   CHOL 165 03/15/2014   Lab Results  Component Value Date   HDL 35* 03/15/2014   Lab Results  Component Value Date   LDLCALC 105* 03/15/2014   Lab Results  Component Value Date   TRIG 125 03/15/2014   Lab Results  Component Value Date   CHOLHDL 4.7 03/15/2014   No results found for this basename: LDLDIRECT      Radiology:  Ct Angio Head W/cm &/or Wo Cm  03/15/2014   CLINICAL DATA:  Hypertension diabetes, RIGHT foot numbness, RIGHT lip numbness for 1 hr.  EXAM: CT ANGIOGRAPHY HEAD AND NECK  TECHNIQUE: Multidetector CT imaging of the head and neck was performed using the standard protocol during bolus administration of intravenous contrast. Multiplanar CT image reconstructions and MIPs were obtained to evaluate the vascular anatomy. Carotid stenosis measurements (when applicable) are obtained utilizing NASCET criteria, using the distal internal carotid diameter as the denominator.  CONTRAST:  50mL OMNIPAQUE IOHEXOL 350 MG/ML SOLN  COMPARISON:  CT of the head March 15, 2014  FINDINGS: CTA HEAD FINDINGS  Anterior circulation: Normal appearance  of the cervical internal carotid arteries, petrous, cavernous and supra clinoid internal carotid arteries. Widely patent anterior communicating artery. Normal appearance of the anterior and middle cerebral arteries.  Posterior circulation: Codominant vertebral arteries with widely patent vertebral arteries, vertebrobasilar junction and basilar artery, as well as main branch vessels. Mild irregularity of the bilateral superior cerebellar  arteries and posterior cerebral arteries.  No large vessel occlusion, hemodynamically significant stenosis, dissection, luminal irregularity, contrast extravasation or aneurysm within the anterior nor posterior circulation.  No abnormal parenchymal or leptomeningeal enhancement.  Review of the MIP images confirms the above findings.  CTA NECK FINDINGS  Normal appearance of the thoracic arch, normal branch pattern. The origins of the innominate, left Common carotid artery and subclavian artery are widely patent.  Bilateral Common carotid arteries are widely patent, coursing in a straight line fashion. 1-2 mm calcific atherosclerosis of the RIGHT carotid bulb with minimal intimal thickening of the LEFT carotid bulb. Otherwise normal appearance of the carotid bifurcations without hemodynamically significant stenosis by NASCET criteria. Normal appearance of the included internal carotid arteries.  Codominant vertebral arteries. Normal appearance of the vertebral arteries, which appear widely patent.  No hemodynamically significant stenosis by NASCET criteria. No dissection, no pseudoaneurysm. No abnormal luminal irregularity. No contrast extravasation.  Soft tissues are nonsuspicious; posterior neck subcutaneous fat stranding and thickening may reflect scarring, subcentimeter radiopaque foreign bodies which could reflect glass. No acute osseous process though bone windows have not been submitted.  Review of the MIP images confirms the above findings.  IMPRESSION: CTA HEAD: Mild luminal  irregularity of the posterior circulation suggests atherosclerosis without hemodynamically significant stenosis nor acute vascular process.  If ongoing concern for acute ischemia, MRI of the brain with diffusion-weighted sequences would be more sensitive.  CTA NECK: No hemodynamically significant stenosis nor acute vascular process.  Acute findings discussed with and reconfirmed by Dr.MCNEILL [facility] on 03/15/2014 at 12:55 am.   Electronically Signed   By: Awilda Metro   On: 03/15/2014 01:12   Ct Head Wo Contrast  03/15/2014   CLINICAL DATA:  Stroke, 24 hr after tPA.  EXAM: CT HEAD WITHOUT CONTRAST  TECHNIQUE: Contiguous axial images were obtained from the base of the skull through the vertex without intravenous contrast.  COMPARISON:  MRI of the head March 15, 2014  FINDINGS: LEFT mesial temporal occipital hypodensity with loss of the gray-white matter differentiation is local sulcal effacement consistent with evolving stroke. No hemorrhagic conversion. Patchy hypodensity and LEFT thalamus consistent with ischemia as seen on prior MRI. No midline shift. Mildly effaced LEFT occipital horn, no hydrocephalus.  No abnormal extra-axial fluid collections. Visualized paranasal sinuses and mastoid air cells are well aerated. Ocular globes and orbital contents are unremarkable.  IMPRESSION: Evolving LEFT posterior cerebral artery territory infarct without hemorrhagic conversion.   Electronically Signed   By: Awilda Metro   On: 03/15/2014 23:37   Ct Head Wo Contrast  03/15/2014   CLINICAL DATA:  Code stroke. Generalized weakness and numbness. Nausea and vomiting.  EXAM: CT HEAD WITHOUT CONTRAST  TECHNIQUE: Contiguous axial images were obtained from the base of the skull through the vertex without intravenous contrast.  COMPARISON:  CT of the head performed 03/14/2014  FINDINGS: There is no evidence of acute infarction, mass lesion, or intra- or extra-axial hemorrhage on CT.  Mild subcortical white  matter change likely reflects small vessel ischemic microangiopathy.  The posterior fossa, including the cerebellum, brainstem and fourth ventricle, is within normal limits. The third and lateral ventricles, and basal ganglia are unremarkable in appearance. The cerebral hemispheres demonstrate grossly normal gray-white differentiation. No mass effect or midline shift is seen.  There is no evidence of fracture; visualized osseous structures are unremarkable in appearance. The orbits are within normal limits. The paranasal sinuses and mastoid air cells are well-aerated. No significant soft tissue abnormalities are  seen.  IMPRESSION: 1. No acute intracranial pathology seen on CT. 2. Mild small vessel ischemic microangiopathy.   Electronically Signed   By: Roanna Raider M.D.   On: 03/15/2014 00:28   Ct Head Wo Contrast  03/14/2014   CLINICAL DATA:  Weakness, numbness.  EXAM: CT HEAD WITHOUT CONTRAST  TECHNIQUE: Contiguous axial images were obtained from the base of the skull through the vertex without intravenous contrast.  COMPARISON:  None.  FINDINGS: No acute intracranial hemorrhage. No focal mass lesion. No CT evidence of acute infarction. No midline shift or mass effect. No hydrocephalus. Basilar cisterns are patent.  Paranasal sinuses and  mastoid air cells are clear.  IMPRESSION: Normal head CT.   Electronically Signed   By: Genevive Bi M.D.   On: 03/14/2014 22:12   Ct Angio Neck W/cm &/or Wo/cm  03/15/2014   CLINICAL DATA:  Hypertension diabetes, RIGHT foot numbness, RIGHT lip numbness for 1 hr.  EXAM: CT ANGIOGRAPHY HEAD AND NECK  TECHNIQUE: Multidetector CT imaging of the head and neck was performed using the standard protocol during bolus administration of intravenous contrast. Multiplanar CT image reconstructions and MIPs were obtained to evaluate the vascular anatomy. Carotid stenosis measurements (when applicable) are obtained utilizing NASCET criteria, using the distal internal carotid  diameter as the denominator.  CONTRAST:  46mL OMNIPAQUE IOHEXOL 350 MG/ML SOLN  COMPARISON:  CT of the head March 15, 2014  FINDINGS: CTA HEAD FINDINGS  Anterior circulation: Normal appearance of the cervical internal carotid arteries, petrous, cavernous and supra clinoid internal carotid arteries. Widely patent anterior communicating artery. Normal appearance of the anterior and middle cerebral arteries.  Posterior circulation: Codominant vertebral arteries with widely patent vertebral arteries, vertebrobasilar junction and basilar artery, as well as main branch vessels. Mild irregularity of the bilateral superior cerebellar arteries and posterior cerebral arteries.  No large vessel occlusion, hemodynamically significant stenosis, dissection, luminal irregularity, contrast extravasation or aneurysm within the anterior nor posterior circulation.  No abnormal parenchymal or leptomeningeal enhancement.  Review of the MIP images confirms the above findings.  CTA NECK FINDINGS  Normal appearance of the thoracic arch, normal branch pattern. The origins of the innominate, left Common carotid artery and subclavian artery are widely patent.  Bilateral Common carotid arteries are widely patent, coursing in a straight line fashion. 1-2 mm calcific atherosclerosis of the RIGHT carotid bulb with minimal intimal thickening of the LEFT carotid bulb. Otherwise normal appearance of the carotid bifurcations without hemodynamically significant stenosis by NASCET criteria. Normal appearance of the included internal carotid arteries.  Codominant vertebral arteries. Normal appearance of the vertebral arteries, which appear widely patent.  No hemodynamically significant stenosis by NASCET criteria. No dissection, no pseudoaneurysm. No abnormal luminal irregularity. No contrast extravasation.  Soft tissues are nonsuspicious; posterior neck subcutaneous fat stranding and thickening may reflect scarring, subcentimeter radiopaque foreign  bodies which could reflect glass. No acute osseous process though bone windows have not been submitted.  Review of the MIP images confirms the above findings.  IMPRESSION: CTA HEAD: Mild luminal irregularity of the posterior circulation suggests atherosclerosis without hemodynamically significant stenosis nor acute vascular process.  If ongoing concern for acute ischemia, MRI of the brain with diffusion-weighted sequences would be more sensitive.  CTA NECK: No hemodynamically significant stenosis nor acute vascular process.  Acute findings discussed with and reconfirmed by Dr.MCNEILL [facility] on 03/15/2014 at 12:55 am.   Electronically Signed   By: Awilda Metro   On: 03/15/2014 01:12   Mr Brain Wo Contrast  03/15/2014   CLINICAL DATA:  Right-sided weakness.  History of previous stroke.  EXAM: MRI HEAD WITHOUT CONTRAST  TECHNIQUE: Multiplanar, multiecho pulse sequences of the brain and surrounding structures were obtained without intravenous contrast.  COMPARISON:  CT studies earlier same day.  FINDINGS: Diffusion imaging shows acute infarction in the complete left posterior cerebral artery territory distribution, affecting the posterior medial temporal lobe, occipital lobe and lateral thalamus. There is mild swelling but there is no hemorrhage or shift. No other areas of acute infarction are seen.  No old infarctions are discernible. The study is degraded by motion on the latter sequences.  No hydrocephalus, mass lesion or extra-axial collection. No pituitary mass. Flow is present in the major vessels at the base of the brain. Sinuses are clear.  IMPRESSION: Acute infarction affecting the complete left posterior cerebral artery territory as outlined above. Swelling but no evidence of hemorrhage or shift.   Electronically Signed   By: Paulina Fusi M.D.   On: 03/15/2014 15:49    EKG:  Sinus tachycardia with LVH and T wave inversions in I, aVL, V4-V5  ASSESSMENT:  1.  Elevated Troponin that is most  likely related to demand ischemia from hypertensive urgency.  This could also be an elevation due to her CVA.  She has not had any chest pain.  Her EKG shows T wave inversions which are more prominent now than initial EKG which could be explained by LVH with repolarization abnormality with LV strain in setting of acute hypertensive urgency.  These T wave changes can also be seen in acute CVA.  I do not think this represents an acute coronary syndrome. 2.  Acute left PCA CVA s/p tPA 3.  Hypertensive urgency - BP improved on Cardene gtt and PRN IV Labetolol but still not adequately controlled 4.  Type II DM uncontrolled with elevated HbA1C 5.  Sinus tachycardia - this is probably rebound from being noncompliant with taking her Atenolol - she did not take her meds on 9/17 and it does not appear that she has received any atenolol since admission to the ER and only 1 dose of Labetolol    PLAN:   1.  Will continue to cycle troponin until it peaks 2.  Will check a stat CPK and MB as well 3.  2D echo in am to assess LVF and RWMA's 4.  Further cardiac workup pending results of echo.  If LVF normal then could consider noninvasive ischemic w/u once recovered from CVA given her CRF's including DM/HTN and dyslipidemia.  5.  Continue ASA/statin 6.  Wean off Cardene gtt as BP tolerates 7.  Restart home dose of Atenolol 8.  Restart ACE I and Clonidine 9.  Check TSH to rule out hyperthyroidism as etiology of HTN and tachycardia   Quintella Reichert, MD  03/15/2014  11:48 PM

## 2014-03-15 NOTE — ED Notes (Signed)
Delay studies initiated.

## 2014-03-15 NOTE — Progress Notes (Signed)
Troponin continued to rise from 1.17 at 0956 to 1.42 at 1713. Notified Dr. Lucia Gaskins and made night shift aware.

## 2014-03-16 ENCOUNTER — Inpatient Hospital Stay (HOSPITAL_COMMUNITY): Payer: Medicaid Other

## 2014-03-16 DIAGNOSIS — I517 Cardiomegaly: Secondary | ICD-10-CM

## 2014-03-16 DIAGNOSIS — I248 Other forms of acute ischemic heart disease: Secondary | ICD-10-CM

## 2014-03-16 DIAGNOSIS — I2489 Other forms of acute ischemic heart disease: Secondary | ICD-10-CM

## 2014-03-16 DIAGNOSIS — I10 Essential (primary) hypertension: Secondary | ICD-10-CM

## 2014-03-16 LAB — CK TOTAL AND CKMB (NOT AT ARMC)
CK TOTAL: 76 U/L (ref 7–177)
CK, MB: 3.9 ng/mL (ref 0.3–4.0)
Relative Index: INVALID (ref 0.0–2.5)

## 2014-03-16 LAB — URINE MICROSCOPIC-ADD ON

## 2014-03-16 LAB — BASIC METABOLIC PANEL
Anion gap: 15 (ref 5–15)
BUN: 18 mg/dL (ref 6–23)
CHLORIDE: 101 meq/L (ref 96–112)
CO2: 23 mEq/L (ref 19–32)
CREATININE: 1.12 mg/dL — AB (ref 0.50–1.10)
Calcium: 8.5 mg/dL (ref 8.4–10.5)
GFR calc Af Amer: 69 mL/min — ABNORMAL LOW (ref >90)
GFR, EST NON AFRICAN AMERICAN: 59 mL/min — AB (ref >90)
Glucose, Bld: 205 mg/dL — ABNORMAL HIGH (ref 70–99)
POTASSIUM: 3.5 meq/L — AB (ref 3.7–5.3)
Sodium: 139 mEq/L (ref 137–147)

## 2014-03-16 LAB — GLUCOSE, CAPILLARY
GLUCOSE-CAPILLARY: 183 mg/dL — AB (ref 70–99)
Glucose-Capillary: 183 mg/dL — ABNORMAL HIGH (ref 70–99)
Glucose-Capillary: 179 mg/dL — ABNORMAL HIGH (ref 70–99)
Glucose-Capillary: 166 mg/dL — ABNORMAL HIGH (ref 70–99)

## 2014-03-16 LAB — URINALYSIS, ROUTINE W REFLEX MICROSCOPIC
BILIRUBIN URINE: NEGATIVE
Glucose, UA: 100 mg/dL — AB
KETONES UR: NEGATIVE mg/dL
NITRITE: NEGATIVE
PROTEIN: 30 mg/dL — AB
Specific Gravity, Urine: 1.018 (ref 1.005–1.030)
Urobilinogen, UA: 0.2 mg/dL (ref 0.0–1.0)
pH: 7 (ref 5.0–8.0)

## 2014-03-16 LAB — CBC
HEMATOCRIT: 36.2 % (ref 36.0–46.0)
Hemoglobin: 12.5 g/dL (ref 12.0–15.0)
MCH: 28.9 pg (ref 26.0–34.0)
MCHC: 34.5 g/dL (ref 30.0–36.0)
MCV: 83.6 fL (ref 78.0–100.0)
PLATELETS: 287 10*3/uL (ref 150–400)
RBC: 4.33 MIL/uL (ref 3.87–5.11)
RDW: 12.4 % (ref 11.5–15.5)
WBC: 13.5 10*3/uL — AB (ref 4.0–10.5)

## 2014-03-16 LAB — TROPONIN I

## 2014-03-16 LAB — PHOSPHORUS: Phosphorus: 3.2 mg/dL (ref 2.3–4.6)

## 2014-03-16 LAB — MAGNESIUM: Magnesium: 1.8 mg/dL (ref 1.5–2.5)

## 2014-03-16 MED ORDER — LISINOPRIL 20 MG PO TABS
20.0000 mg | ORAL_TABLET | Freq: Every day | ORAL | Status: DC
  Administered 2014-03-16 – 2014-03-20 (×5): 20 mg via ORAL
  Filled 2014-03-16 (×5): qty 1

## 2014-03-16 MED ORDER — POTASSIUM CHLORIDE CRYS ER 20 MEQ PO TBCR
40.0000 meq | EXTENDED_RELEASE_TABLET | Freq: Two times a day (BID) | ORAL | Status: AC
  Administered 2014-03-16 (×2): 40 meq via ORAL
  Filled 2014-03-16 (×2): qty 2

## 2014-03-16 MED ORDER — DEXTROSE 5 % IV SOLN
1.0000 g | INTRAVENOUS | Status: DC
  Administered 2014-03-16 – 2014-03-19 (×4): 1 g via INTRAVENOUS
  Filled 2014-03-16 (×5): qty 10

## 2014-03-16 MED ORDER — CLONIDINE HCL 0.1 MG PO TABS
0.2000 mg | ORAL_TABLET | Freq: Two times a day (BID) | ORAL | Status: DC
  Administered 2014-03-16 – 2014-03-18 (×5): 0.2 mg via ORAL
  Filled 2014-03-16 (×3): qty 1
  Filled 2014-03-16: qty 2
  Filled 2014-03-16: qty 1

## 2014-03-16 MED ORDER — LORAZEPAM 0.5 MG PO TABS
0.5000 mg | ORAL_TABLET | Freq: Four times a day (QID) | ORAL | Status: DC | PRN
Start: 2014-03-16 — End: 2014-03-20

## 2014-03-16 MED ORDER — HYDROCHLOROTHIAZIDE 25 MG PO TABS
25.0000 mg | ORAL_TABLET | Freq: Every day | ORAL | Status: DC
  Administered 2014-03-16 – 2014-03-20 (×5): 25 mg via ORAL
  Filled 2014-03-16 (×5): qty 1

## 2014-03-16 MED ORDER — ASPIRIN 325 MG PO TABS
325.0000 mg | ORAL_TABLET | Freq: Every day | ORAL | Status: DC
  Administered 2014-03-16 – 2014-03-20 (×5): 325 mg via ORAL
  Filled 2014-03-16 (×5): qty 1

## 2014-03-16 MED ORDER — METFORMIN HCL 500 MG PO TABS
500.0000 mg | ORAL_TABLET | Freq: Two times a day (BID) | ORAL | Status: DC
  Administered 2014-03-16 – 2014-03-20 (×8): 500 mg via ORAL
  Filled 2014-03-16 (×10): qty 1

## 2014-03-16 MED ORDER — ASPIRIN 325 MG PO TABS
325.0000 mg | ORAL_TABLET | Freq: Every day | ORAL | Status: DC
  Administered 2014-03-16: 325 mg via ORAL
  Filled 2014-03-16: qty 1

## 2014-03-16 MED ORDER — ATENOLOL 25 MG PO TABS
50.0000 mg | ORAL_TABLET | Freq: Every day | ORAL | Status: DC
  Administered 2014-03-16 – 2014-03-20 (×5): 50 mg via ORAL
  Filled 2014-03-16 (×2): qty 1
  Filled 2014-03-16 (×3): qty 2

## 2014-03-16 NOTE — Progress Notes (Signed)
Patient Name: Sharon May Date of Encounter: 03/16/2014  Active Problems:   Stroke   Length of Stay: 2  SUBJECTIVE  Recovered some of her neuro deficits. troponin seems to be peaking at 1.4 No angina ECG typical for LVH Will review echo, just done  CURRENT MEDS . aspirin  325 mg Oral Daily  . atenolol  50 mg Oral Daily  . atorvastatin  20 mg Oral q1800  . cloNIDine  0.2 mg Oral BID  . hydrochlorothiazide  25 mg Oral Daily  . insulin aspart  0-15 Units Subcutaneous TID WC  . lisinopril  20 mg Oral Daily  . metFORMIN  500 mg Oral BID WC  . pantoprazole (PROTONIX) IV  40 mg Intravenous QHS  . potassium chloride  40 mEq Oral BID    OBJECTIVE   Intake/Output Summary (Last 24 hours) at 03/16/14 1122 Last data filed at 03/16/14 0900  Gross per 24 hour  Intake   2710 ml  Output    500 ml  Net   2210 ml   Filed Weights   03/14/14 2108  Weight: 72.576 kg (160 lb)    PHYSICAL EXAM Filed Vitals:   03/16/14 0800 03/16/14 0830 03/16/14 0900 03/16/14 0930  BP: 173/95 175/100 186/97 178/103  Pulse: 98 101 98 93  Temp: 99.4 F (37.4 C)     TempSrc: Oral     Resp: 25     Height:      Weight:      SpO2: 100% 100% 100% 99%   General: Alert, oriented x3, no distress Head: no evidence of trauma, PERRL, EOMI, no exophtalmos or lid lag, no myxedema, no xanthelasma; normal ears, nose and oropharynx Neck: normal jugular venous pulsations and no hepatojugular reflux; brisk carotid pulses without delay and no carotid bruits Chest: clear to auscultation, no signs of consolidation by percussion or palpation, normal fremitus, symmetrical and full respiratory excursions Cardiovascular: normal position and quality of the apical impulse, regular rhythm, normal first and second heart sounds, no rubs, loud S4, no murmur Abdomen: no tenderness or distention, no masses by palpation, no abnormal pulsatility or arterial bruits, normal bowel sounds, no hepatosplenomegaly Extremities: no  clubbing, cyanosis or edema; 2+ radial, ulnar and brachial pulses bilaterally; 2+ right femoral, posterior tibial and dorsalis pedis pulses; 2+ left femoral, posterior tibial and dorsalis pedis pulses; no subclavian or femoral bruits Neurological: grossly nonfocal  LABS  CBC  Recent Labs  03/14/14 2137  WBC 11.3*  NEUTROABS 7.2  HGB 12.6  HCT 35.1*  MCV 81.8  PLT 311   Basic Metabolic Panel  Recent Labs  03/14/14 2137  NA 139  K 3.1*  CL 98  CO2 24  GLUCOSE 236*  BUN 19  CREATININE 1.10  CALCIUM 9.7   Liver Function Tests  Recent Labs  03/14/14 2137  AST 19  ALT 16  ALKPHOS 69  BILITOT 0.3  PROT 8.7*  ALBUMIN 4.2   No results found for this basename: LIPASE, AMYLASE,  in the last 72 hours Cardiac Enzymes  Recent Labs  03/15/14 0248 03/15/14 0956 03/15/14 1713 03/15/14 2334  CKTOTAL  --   --   --  76  CKMB  --   --   --  3.9  TROPONINI 0.42* 1.17* 1.42*  --    BNP No components found with this basename: POCBNP,  D-Dimer No results found for this basename: DDIMER,  in the last 72 hours Hemoglobin A1C  Recent Labs  03/15/14 0248  HGBA1C 8.7*  Fasting Lipid Panel  Recent Labs  03/15/14 0249  CHOL 165  HDL 35*  LDLCALC 105*  TRIG 125  CHOLHDL 4.7   Thyroid Function Tests No results found for this basename: TSH, T4TOTAL, FREET3, T3FREE, THYROIDAB,  in the last 72 hours  Radiology Studies Imaging results have been reviewed and Ct Angio Head W/cm &/or Wo Cm  03/15/2014   CLINICAL DATA:  Hypertension diabetes, RIGHT foot numbness, RIGHT lip numbness for 1 hr.  EXAM: CT ANGIOGRAPHY HEAD AND NECK  TECHNIQUE: Multidetector CT imaging of the head and neck was performed using the standard protocol during bolus administration of intravenous contrast. Multiplanar CT image reconstructions and MIPs were obtained to evaluate the vascular anatomy. Carotid stenosis measurements (when applicable) are obtained utilizing NASCET criteria, using the distal  internal carotid diameter as the denominator.  CONTRAST:  50mL OMNIPAQUE IOHEXOL 350 MG/ML SOLN  COMPARISON:  CT of the head March 15, 2014  FINDINGS: CTA HEAD FINDINGS  Anterior circulation: Normal appearance of the cervical internal carotid arteries, petrous, cavernous and supra clinoid internal carotid arteries. Widely patent anterior communicating artery. Normal appearance of the anterior and middle cerebral arteries.  Posterior circulation: Codominant vertebral arteries with widely patent vertebral arteries, vertebrobasilar junction and basilar artery, as well as main branch vessels. Mild irregularity of the bilateral superior cerebellar arteries and posterior cerebral arteries.  No large vessel occlusion, hemodynamically significant stenosis, dissection, luminal irregularity, contrast extravasation or aneurysm within the anterior nor posterior circulation.  No abnormal parenchymal or leptomeningeal enhancement.  Review of the MIP images confirms the above findings.  CTA NECK FINDINGS  Normal appearance of the thoracic arch, normal branch pattern. The origins of the innominate, left Common carotid artery and subclavian artery are widely patent.  Bilateral Common carotid arteries are widely patent, coursing in a straight line fashion. 1-2 mm calcific atherosclerosis of the RIGHT carotid bulb with minimal intimal thickening of the LEFT carotid bulb. Otherwise normal appearance of the carotid bifurcations without hemodynamically significant stenosis by NASCET criteria. Normal appearance of the included internal carotid arteries.  Codominant vertebral arteries. Normal appearance of the vertebral arteries, which appear widely patent.  No hemodynamically significant stenosis by NASCET criteria. No dissection, no pseudoaneurysm. No abnormal luminal irregularity. No contrast extravasation.  Soft tissues are nonsuspicious; posterior neck subcutaneous fat stranding and thickening may reflect scarring, subcentimeter  radiopaque foreign bodies which could reflect glass. No acute osseous process though bone windows have not been submitted.  Review of the MIP images confirms the above findings.  IMPRESSION: CTA HEAD: Mild luminal irregularity of the posterior circulation suggests atherosclerosis without hemodynamically significant stenosis nor acute vascular process.  If ongoing concern for acute ischemia, MRI of the brain with diffusion-weighted sequences would be more sensitive.  CTA NECK: No hemodynamically significant stenosis nor acute vascular process.  Acute findings discussed with and reconfirmed by Dr.MCNEILL [facility] on 03/15/2014 at 12:55 am.   Electronically Signed   By: Awilda Metro   On: 03/15/2014 01:12   Ct Head Wo Contrast  03/15/2014   CLINICAL DATA:  Stroke, 24 hr after tPA.  EXAM: CT HEAD WITHOUT CONTRAST  TECHNIQUE: Contiguous axial images were obtained from the base of the skull through the vertex without intravenous contrast.  COMPARISON:  MRI of the head March 15, 2014  FINDINGS: LEFT mesial temporal occipital hypodensity with loss of the gray-white matter differentiation is local sulcal effacement consistent with evolving stroke. No hemorrhagic conversion. Patchy hypodensity and LEFT thalamus consistent with ischemia as seen  on prior MRI. No midline shift. Mildly effaced LEFT occipital horn, no hydrocephalus.  No abnormal extra-axial fluid collections. Visualized paranasal sinuses and mastoid air cells are well aerated. Ocular globes and orbital contents are unremarkable.  IMPRESSION: Evolving LEFT posterior cerebral artery territory infarct without hemorrhagic conversion.   Electronically Signed   By: Awilda Metro   On: 03/15/2014 23:37   Ct Head Wo Contrast  03/15/2014   CLINICAL DATA:  Code stroke. Generalized weakness and numbness. Nausea and vomiting.  EXAM: CT HEAD WITHOUT CONTRAST  TECHNIQUE: Contiguous axial images were obtained from the base of the skull through the vertex  without intravenous contrast.  COMPARISON:  CT of the head performed 03/14/2014  FINDINGS: There is no evidence of acute infarction, mass lesion, or intra- or extra-axial hemorrhage on CT.  Mild subcortical white matter change likely reflects small vessel ischemic microangiopathy.  The posterior fossa, including the cerebellum, brainstem and fourth ventricle, is within normal limits. The third and lateral ventricles, and basal ganglia are unremarkable in appearance. The cerebral hemispheres demonstrate grossly normal gray-white differentiation. No mass effect or midline shift is seen.  There is no evidence of fracture; visualized osseous structures are unremarkable in appearance. The orbits are within normal limits. The paranasal sinuses and mastoid air cells are well-aerated. No significant soft tissue abnormalities are seen.  IMPRESSION: 1. No acute intracranial pathology seen on CT. 2. Mild small vessel ischemic microangiopathy.   Electronically Signed   By: Roanna Raider M.D.   On: 03/15/2014 00:28   Ct Head Wo Contrast  03/14/2014   CLINICAL DATA:  Weakness, numbness.  EXAM: CT HEAD WITHOUT CONTRAST  TECHNIQUE: Contiguous axial images were obtained from the base of the skull through the vertex without intravenous contrast.  COMPARISON:  None.  FINDINGS: No acute intracranial hemorrhage. No focal mass lesion. No CT evidence of acute infarction. No midline shift or mass effect. No hydrocephalus. Basilar cisterns are patent.  Paranasal sinuses and  mastoid air cells are clear.  IMPRESSION: Normal head CT.   Electronically Signed   By: Genevive Bi M.D.   On: 03/14/2014 22:12   Ct Angio Neck W/cm &/or Wo/cm  03/15/2014   CLINICAL DATA:  Hypertension diabetes, RIGHT foot numbness, RIGHT lip numbness for 1 hr.  EXAM: CT ANGIOGRAPHY HEAD AND NECK  TECHNIQUE: Multidetector CT imaging of the head and neck was performed using the standard protocol during bolus administration of intravenous contrast.  Multiplanar CT image reconstructions and MIPs were obtained to evaluate the vascular anatomy. Carotid stenosis measurements (when applicable) are obtained utilizing NASCET criteria, using the distal internal carotid diameter as the denominator.  CONTRAST:  50mL OMNIPAQUE IOHEXOL 350 MG/ML SOLN  COMPARISON:  CT of the head March 15, 2014  FINDINGS: CTA HEAD FINDINGS  Anterior circulation: Normal appearance of the cervical internal carotid arteries, petrous, cavernous and supra clinoid internal carotid arteries. Widely patent anterior communicating artery. Normal appearance of the anterior and middle cerebral arteries.  Posterior circulation: Codominant vertebral arteries with widely patent vertebral arteries, vertebrobasilar junction and basilar artery, as well as main branch vessels. Mild irregularity of the bilateral superior cerebellar arteries and posterior cerebral arteries.  No large vessel occlusion, hemodynamically significant stenosis, dissection, luminal irregularity, contrast extravasation or aneurysm within the anterior nor posterior circulation.  No abnormal parenchymal or leptomeningeal enhancement.  Review of the MIP images confirms the above findings.  CTA NECK FINDINGS  Normal appearance of the thoracic arch, normal branch pattern. The origins of the innominate,  left Common carotid artery and subclavian artery are widely patent.  Bilateral Common carotid arteries are widely patent, coursing in a straight line fashion. 1-2 mm calcific atherosclerosis of the RIGHT carotid bulb with minimal intimal thickening of the LEFT carotid bulb. Otherwise normal appearance of the carotid bifurcations without hemodynamically significant stenosis by NASCET criteria. Normal appearance of the included internal carotid arteries.  Codominant vertebral arteries. Normal appearance of the vertebral arteries, which appear widely patent.  No hemodynamically significant stenosis by NASCET criteria. No dissection, no  pseudoaneurysm. No abnormal luminal irregularity. No contrast extravasation.  Soft tissues are nonsuspicious; posterior neck subcutaneous fat stranding and thickening may reflect scarring, subcentimeter radiopaque foreign bodies which could reflect glass. No acute osseous process though bone windows have not been submitted.  Review of the MIP images confirms the above findings.  IMPRESSION: CTA HEAD: Mild luminal irregularity of the posterior circulation suggests atherosclerosis without hemodynamically significant stenosis nor acute vascular process.  If ongoing concern for acute ischemia, MRI of the brain with diffusion-weighted sequences would be more sensitive.  CTA NECK: No hemodynamically significant stenosis nor acute vascular process.  Acute findings discussed with and reconfirmed by Dr.MCNEILL [facility] on 03/15/2014 at 12:55 am.   Electronically Signed   By: Awilda Metro   On: 03/15/2014 01:12   Mr Brain Wo Contrast  03/15/2014   CLINICAL DATA:  Right-sided weakness.  History of previous stroke.  EXAM: MRI HEAD WITHOUT CONTRAST  TECHNIQUE: Multiplanar, multiecho pulse sequences of the brain and surrounding structures were obtained without intravenous contrast.  COMPARISON:  CT studies earlier same day.  FINDINGS: Diffusion imaging shows acute infarction in the complete left posterior cerebral artery territory distribution, affecting the posterior medial temporal lobe, occipital lobe and lateral thalamus. There is mild swelling but there is no hemorrhage or shift. No other areas of acute infarction are seen.  No old infarctions are discernible. The study is degraded by motion on the latter sequences.  No hydrocephalus, mass lesion or extra-axial collection. No pituitary mass. Flow is present in the major vessels at the base of the brain. Sinuses are clear.  IMPRESSION: Acute infarction affecting the complete left posterior cerebral artery territory as outlined above. Swelling but no evidence of  hemorrhage or shift.   Electronically Signed   By: Paulina Fusi M.D.   On: 03/15/2014 15:49    TELE NSR/mild sinus tachy  ECG NSR, LVH  ASSESSMENT AND PLAN Will review echo. 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 She reports having a stress test in [facility] just last month, reportedly normal. I see a Lexiscan Myoview on 8/11, but cannot pull up report on [facility]where. Echo December 06 2013 shows severe LVH, EF 40-45%, global hypokinesis.  If our repeat echo does not show regional abnormalities, no further workup is planned. Need to focus on gradual reduction in BP to normal range with preferential use of ACEi/ARB and beta blockers.  Thurmon Fair, MD, [facility] [phone] office [phone] pager 03/16/2014 11:22 AM

## 2014-03-16 NOTE — Plan of Care (Signed)
Problem: Progression Outcomes Goal: Bowel & Bladder Continence Foley removed, and patient void within the 4 hours.

## 2014-03-16 NOTE — Evaluation (Signed)
Physical Therapy Evaluation Patient Details Name: Sharon May MRN: [id number] DOB: [birth year] Today's Date: 03/16/2014   History of Present Illness  Admitted to [facility] ED withc/o R upper lip numbness and R foot tightness.  Symptoms evolved to R upper and lower extremity weakness..  Transported to [facility] for tPa.  Work up continues.  MRI shows posterior distribution stroke.  Clinical Impression  Pt admitted with/for s/s of stroke.  Pt currently limited functionally due to the problems listed. ( See problems list.)   Pt will benefit from PT to maximize function and safety in order to get ready for next venue listed below.     Follow Up Recommendations CIR    Equipment Recommendations  Other (comment) (TBA)    Recommendations for Other Services Rehab consult     Precautions / Restrictions Precautions Precautions: Fall Restrictions Weight Bearing Restrictions: No      Mobility  Bed Mobility Overal bed mobility: Needs Assistance Bed Mobility: Supine to Sit     Supine to sit: Min assist     General bed mobility comments: cues for direction, guidance; truncal assist  Transfers Overall transfer level: Needs assistance   Transfers: Sit to/from Stand Sit to Stand: Min assist         General transfer comment: cues for safety; steady asssit  Ambulation/Gait Ambulation/Gait assistance: Mod assist;+2 safety/equipment Ambulation Distance (Feet): 45 Feet (15 x2 to/from bathroom) Assistive device:  (iv pole) Gait Pattern/deviations: Step-through pattern;Ataxic;Decreased step length - left;Decreased stance time - right;Decreased stride length;Wide base of support Gait velocity: slow   General Gait Details: Ataxia on right, moderate lean to fight  Stairs            Wheelchair Mobility    Modified Rankin (Stroke Patients Only) Modified Rankin (Stroke Patients Only) Pre-Morbid Rankin Score: No symptoms Modified Rankin: Moderately severe disability      Balance Overall balance assessment: Needs assistance Sitting-balance support: No upper extremity supported;Feet supported Sitting balance-Leahy Scale: Fair Sitting balance - Comments: can accept minimal challenge   Standing balance support: Bilateral upper extremity supported;Single extremity supported Standing balance-Leahy Scale: Poor Standing balance comment: moderate list Rgith                             Pertinent Vitals/Pain Pain Assessment: No/denies pain    Home Living Family/patient expects to be discharged to:: Private residence Living Arrangements: Children Available Help at Discharge: Family;Available 24 hours/day Type of Home: House Home Access: Stairs to enter Entrance Stairs-Rails: Doctor, general practice of Steps: 3 Home Layout: One level;Able to live on main level with bedroom/bathroom Home Equipment: None      Prior Function Level of Independence: Independent               Hand Dominance        Extremity/Trunk Assessment   Upper Extremity Assessment: RUE deficits/detail RUE Deficits / Details:  (weak uncoordinated grip, gross flexion at elbow/shd >3+, ext)         Lower Extremity Assessment: RLE deficits/detail RLE Deficits / Details: grossly flexion/extension >3+, poor coordination.  ankle everters1-2/5  with ankle instability    Cervical / Trunk Assessment: Normal  Communication   Communication: No difficulties  Cognition Arousal/Alertness: Awake/alert Behavior During Therapy: WFL for tasks assessed/performed;Restless Overall Cognitive Status: Impaired/Different from baseline Area of Impairment: Orientation;Attention;Following commands;Problem solving Orientation Level: Situation Current Attention Level: Selective   Following Commands: Follows one step commands with increased time  Problem Solving: Slow processing;Decreased initiation      General Comments      Exercises        Assessment/Plan     PT Assessment Patient needs continued PT services  PT Diagnosis Abnormality of gait;Hemiplegia dominant side   PT Problem List Decreased strength;Decreased activity tolerance;Decreased balance;Decreased mobility;Decreased coordination;Decreased safety awareness  PT Treatment Interventions Gait training;Functional mobility training;Therapeutic activities;Balance training;Neuromuscular re-education;Patient/family education   PT Goals (Current goals can be found in the Care Plan section) Acute Rehab PT Goals Patient Stated Goal: pt not focus enough to work on goals PT Goal Formulation: With patient/family Time For Goal Achievement: 03/30/14 Potential to Achieve Goals: Good    Frequency Min 3X/week   Barriers to discharge        Co-evaluation               End of Session   Activity Tolerance: Patient tolerated treatment well Patient left: in bed;with call bell/phone within reach;with family/visitor present Nurse Communication: Mobility status         Time: 2637-8588 PT Time Calculation (min): 38 min   Charges:   PT Evaluation $Initial PT Evaluation Tier I: 1 Procedure PT Treatments $Gait Training: 8-22 mins $Therapeutic Activity: 8-22 mins   PT G Codes:          Yazeed Pryer, Eliseo Gum 03/16/2014, 11:32 AM  03/16/2014  Trion Bing, PT [phone]  (pager)

## 2014-03-16 NOTE — Progress Notes (Signed)
STROKE TEAM PROGRESS NOTE   HISTORY Sharon May is a 43 y.o. female with a history of hypertension, diabetes who presents with right arm weakness that started earlier tonight. When she initially arrived to [facility] this complaint, she had very mild symptoms with some mild discoordination of her right hand, but otherwise no symptoms including visual problems. A code stroke was activated, but given that she did not have significantly disabling symptoms initially, she was getting a head CT, etc at [facility] in anticipation of admittion. During workup, however, she had progressive worsening, and therefore a code stroke was re-activated and she was transported to [facility] to receive tPA.  Here, it was noted that she also had developed a hemianopia and left gaze preference.  LKW: 8:30 PM  tpa given?: yes   She was admitted to the neuro ICU for further evaluation and treatment.   SUBJECTIVE (INTERVAL HISTORY) Her exam is at the bedside.  She is a poor historian. She says her boyfriend brought her to the ED because she was vomiting and the hospital must have noticed that she was weak. No family at bedside. She said she did not notice any weakness in the ED but did feel like she was talking slowly. She reports her vision has always been bad. She does say that her right arm feels funny now but that her speech is back to normal.    OBJECTIVE Temp:  [98.3 F (36.8 C)-99.5 F (37.5 C)] 99.4 F (37.4 C) (09/20 0800) Pulse Rate:  [87-110] 92 (09/20 1230) Cardiac Rhythm:  [-] Normal sinus rhythm (09/20 0800) Resp:  [14-33] 25 (09/20 0800) BP: (139-186)/(58-103) 177/92 mmHg (09/20 1230) SpO2:  [95 %-100 %] 100 % (09/20 1230)   Recent Labs Lab 03/15/14 1208 03/15/14 1705 03/15/14 2132 03/16/14 0834 03/16/14 1226  GLUCAP 175* 213* 173* 183* 183*    Recent Labs Lab 03/14/14 2137  NA 139  K 3.1*  CL 98  CO2 24  GLUCOSE 236*  BUN 19  CREATININE 1.10  CALCIUM 9.7    Recent Labs Lab  03/14/14 2137  AST 19  ALT 16  ALKPHOS 69  BILITOT 0.3  PROT 8.7*  ALBUMIN 4.2    Recent Labs Lab 03/14/14 2137 03/16/14 1250  WBC 11.3* 13.5*  NEUTROABS 7.2  --   HGB 12.6 12.5  HCT 35.1* 36.2  MCV 81.8 83.6  PLT 311 287    Recent Labs Lab 03/14/14 2137 03/15/14 0248 03/15/14 0956 03/15/14 1713 03/15/14 2334  CKTOTAL  --   --   --   --  76  CKMB  --   --   --   --  3.9  TROPONINI 0.61* 0.42* 1.17* 1.42*  --     Recent Labs  03/14/14 2137  LABPROT 12.9  INR 0.97    Recent Labs  03/14/14 2214 03/15/14 0136  COLORURINE YELLOW STRAW*  LABSPEC 1.014 1.015  PHURINE 7.0 7.5  GLUCOSEU 250* >1000*  HGBUR NEGATIVE NEGATIVE  BILIRUBINUR NEGATIVE NEGATIVE  KETONESUR NEGATIVE NEGATIVE  PROTEINUR 30* NEGATIVE  UROBILINOGEN 0.2 0.2  NITRITE NEGATIVE NEGATIVE  LEUKOCYTESUR LARGE* NEGATIVE       Component Value Date/Time   CHOL 165 03/15/2014 0249   TRIG 125 03/15/2014 0249   HDL 35* 03/15/2014 0249   CHOLHDL 4.7 03/15/2014 0249   VLDL 25 03/15/2014 0249   LDLCALC 105* 03/15/2014 0249   Lab Results  Component Value Date   HGBA1C 8.7* 03/15/2014      Component Value  Date/Time   LABOPIA NONE DETECTED 03/14/2014 2215   COCAINSCRNUR NONE DETECTED 03/14/2014 2215   LABBENZ NONE DETECTED 03/14/2014 2215   AMPHETMU NONE DETECTED 03/14/2014 2215   THCU NONE DETECTED 03/14/2014 2215   LABBARB NONE DETECTED 03/14/2014 2215     Recent Labs Lab 03/14/14 2137  ETH <11   2D Echo:  - Left ventricle: The cavity size was normal. There was severe concentric hypertrophy. Systolic function was mildly to moderately reduced. The estimated ejection fraction was in the range of 40% to 45%. Mild diffuse hypokinesis with no identifiable regional variations. Doppler parameters are consistent with abnormal left ventricular relaxation (grade 1 diastolic dysfunction). - Left atrium: The atrium was moderately to severely dilated.    Ct Angio Head W/cm &/or Wo Cm  03/15/2014   IMPRESSION: CTA HEAD: Mild luminal irregularity of the posterior circulation suggests atherosclerosis without hemodynamically significant stenosis nor acute vascular process.  If ongoing concern for acute ischemia, MRI of the brain with diffusion-weighted sequences would be more sensitive.  CTA NECK: No hemodynamically significant stenosis nor acute vascular process.     Ct Head Wo Contrast  03/15/2014   IMPRESSION: 1. No acute intracranial pathology seen on CT. 2. Mild small vessel ischemic microangiopathy.   Ct Head Wo Contrast  03/14/2014    IMPRESSION: Normal head CT.     Ct Angio Neck W/cm &/or Wo/cm  03/15/2014    IMPRESSION: CTA HEAD: Mild luminal irregularity of the posterior circulation suggests atherosclerosis without hemodynamically significant stenosis nor acute vascular process.  If ongoing concern for acute ischemia, MRI of the brain with diffusion-weighted sequences would be more sensitive.  CTA NECK: No hemodynamically significant stenosis nor acute vascular process.    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 Swelling but no evidence of hemorrhage or shift.   Electronically Signed   By: Paulina Fusi M.D.   On: 03/15/2014 15:49    PHYSICAL EXAM Physical exam: Exam: Gen: Agitated, anxious Eyes: anicteric sclerae, moist conjunctivae                    CV: RRR, no MRG Mental Status: Alert, Not following commands  Neuro: Detailed Neurologic Exam  Cognition: Says she is "ready to go home" and is very anxious. Knows her name, says she in the hospital, but says it is August and does not know the year. Denies drug use "I am too fly to use drugs". 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   Speech:    No dysarthria, no aphasia  Cranial Nerves:    The pupils are equal, round, and reactive to light. EOMI. Face  symmetric. Right homonomous hemianopia. Conjugate gaze. No gaze preference.   Motor Observation:    no involuntary movements noted.    Strength:    Decreased strength right arm 2+/5. Able to move all other extremities anti gravity but right leg appears weaker than left 3+/5.   Reflexes: brisk throughout  Toes: downgoing     Cortical Sensory Modalities:     Right-sided neglect     ASSESSMENT/PLAN  Sharon May is a 43 y.o. female with a history of hypertension, diabetes who presents with right arm weakness last evening.  She did receive IV t-PA . Imaging confirms acute infarction affecting the complete left posterior cerebral artery. She is very  anxious and appears confused.  She is non-compliant with her home medications. Stroke work up underway.  Stroke  ASA 325  Carotid Doppler pending  2D Echo: Severe left ventricle concentric hypertrophy. Cardiology consulted.   LDL 105 statin necessary as meeting goal of LDL <70 (diabetes)  HgbA1c 8.7  SCDs for VTE prophylaxis  Up and out of bed  Therapy needs:  PT/OT and speech  Disposition:  pending  Hypertension   Post TPA <180/105 for 24-48 hours and then gradually normalize within 5-7 days.  BP goal long term 130/80  Hyperlipidemia  LDL 105  LDL goal <70 for diabetics  Start Lipitor  qday  Diabetes  Home meds: Metformin. Non-compliant with home medications.   HgbA1c 8.7  Uncontrolled  Goal < 7.0  Will need to educate patient about lifestyle changes for diabetes treatment  Other Active Problems  Elevated Troponin I 0.61 -> 0.42 -> 1.17 -> 1.42. Cardiology consulted.  Confusion Patient is not oriented and WBCs increasing. Will order infectious workup.  Hospital day # 2   Reviewed images CT of the head , MRI of the brain.  Artemio Aly, MD Neuro Stroke   To contact Stroke Continuity provider, please refer to [email or web address]. After hours, contact General Neurology

## 2014-03-16 NOTE — Progress Notes (Signed)
  Echocardiogram 2D Echocardiogram has been performed.  Arvil Chaco 03/16/2014, 10:19 AM

## 2014-03-17 DIAGNOSIS — I10 Essential (primary) hypertension: Secondary | ICD-10-CM | POA: Diagnosis present

## 2014-03-17 DIAGNOSIS — R000 Tachycardia, unspecified: Secondary | ICD-10-CM | POA: Diagnosis present

## 2014-03-17 DIAGNOSIS — R29898 Other symptoms and signs involving the musculoskeletal system: Secondary | ICD-10-CM

## 2014-03-17 DIAGNOSIS — I428 Other cardiomyopathies: Secondary | ICD-10-CM | POA: Diagnosis present

## 2014-03-17 DIAGNOSIS — I248 Other forms of acute ischemic heart disease: Secondary | ICD-10-CM | POA: Diagnosis present

## 2014-03-17 LAB — GLUCOSE, CAPILLARY
GLUCOSE-CAPILLARY: 176 mg/dL — AB (ref 70–99)
Glucose-Capillary: 166 mg/dL — ABNORMAL HIGH (ref 70–99)
Glucose-Capillary: 166 mg/dL — ABNORMAL HIGH (ref 70–99)

## 2014-03-17 MED ORDER — POTASSIUM CHLORIDE CRYS ER 20 MEQ PO TBCR: 40.0000 meq | EXTENDED_RELEASE_TABLET | Freq: Every day | ORAL | Status: DC

## 2014-03-17 MED ORDER — SODIUM CHLORIDE 0.9 % IV SOLN
INTRAVENOUS | Status: DC
  Administered 2014-03-18: 100 mL via INTRAVENOUS

## 2014-03-17 NOTE — Progress Notes (Signed)
STROKE TEAM PROGRESS NOTE   HISTORY Sharon May is a 43 y.o. female with a history of hypertension, diabetes who presents with right arm weakness that started earlier tonight. When she initially arrived to [facility] this complaint, she had very mild symptoms with some mild discoordination of her right hand, but otherwise no symptoms including visual problems. A code stroke was activated, but given that she did not have significantly disabling symptoms initially, she was getting a head CT, etc at [facility] in anticipation of admittion. During workup, however, she had progressive worsening, and therefore a code stroke was re-activated and she was transported to [facility] to receive tPA.  Here, it was noted that she also had developed a hemianopia and left gaze preference.  LKW: 8:30 PM  tpa given?: yes   She was admitted to the neuro ICU for further evaluation and treatment.   SUBJECTIVE (INTERVAL HISTORY) Her  daughter is at the bedside.   She can right-sided vision loss but feels numbness and strength are improved. Her blood pressure has been under good control.  OBJECTIVE Temp:  [98.2 F (36.8 C)-99.9 F (37.7 C)] 98.8 F (37.1 C) (09/21 0746) Pulse Rate:  [68-86] 72 (09/21 1400) Cardiac Rhythm:  [-] Normal sinus rhythm (09/20 2000) Resp:  [10-32] 17 (09/21 1400) BP: (148-184)/(78-97) 167/82 mmHg (09/21 1400) SpO2:  [98 %-100 %] 100 % (09/21 1400)   Recent Labs Lab 03/16/14 1226 03/16/14 1712 03/16/14 2121 03/17/14 0745 03/17/14 1337  GLUCAP 183* 179* 166* 176* 166*    Recent Labs Lab 03/14/14 2137 03/16/14 1250  NA 139 139  K 3.1* 3.5*  CL 98 101  CO2 24 23  GLUCOSE 236* 205*  BUN 19 18  CREATININE 1.10 1.12*  CALCIUM 9.7 8.5  MG  --  1.8  PHOS  --  3.2    Recent Labs Lab 03/14/14 2137  AST 19  ALT 16  ALKPHOS 69  BILITOT 0.3  PROT 8.7*  ALBUMIN 4.2    Recent Labs Lab 03/14/14 2137 03/16/14 1250  WBC 11.3* 13.5*  NEUTROABS 7.2  --   HGB 12.6  12.5  HCT 35.1* 36.2  MCV 81.8 83.6  PLT 311 287    Recent Labs Lab 03/14/14 2137 03/15/14 0248 03/15/14 0956 03/15/14 1713 03/15/14 2334 03/16/14 1250  CKTOTAL  --   --   --   --  76  --   CKMB  --   --   --   --  3.9  --   TROPONINI 0.61* 0.42* 1.17* 1.42*  --  <0.30    Recent Labs  03/14/14 2137  LABPROT 12.9  INR 0.97    Recent Labs  03/15/14 0136 03/16/14 1659  COLORURINE STRAW* YELLOW  LABSPEC 1.015 1.018  PHURINE 7.5 7.0  GLUCOSEU >1000* 100*  HGBUR NEGATIVE LARGE*  BILIRUBINUR NEGATIVE NEGATIVE  KETONESUR NEGATIVE NEGATIVE  PROTEINUR NEGATIVE 30*  UROBILINOGEN 0.2 0.2  NITRITE NEGATIVE NEGATIVE  LEUKOCYTESUR NEGATIVE LARGE*       Component Value Date/Time   CHOL 165 03/15/2014 0249   TRIG 125 03/15/2014 0249   HDL 35* 03/15/2014 0249   CHOLHDL 4.7 03/15/2014 0249   VLDL 25 03/15/2014 0249   LDLCALC 105* 03/15/2014 0249   Lab Results  Component Value Date   HGBA1C 8.7* 03/15/2014      Component Value Date/Time   LABOPIA NONE DETECTED 03/14/2014 2215   COCAINSCRNUR NONE DETECTED 03/14/2014 2215   LABBENZ NONE DETECTED 03/14/2014 2215   AMPHETMU NONE  DETECTED 03/14/2014 2215   THCU NONE DETECTED 03/14/2014 2215   LABBARB NONE DETECTED 03/14/2014 2215     Recent Labs Lab 03/14/14 2137  ETH <11    Ct Angio Head W/cm &/or Wo Cm  03/15/2014  IMPRESSION: CTA HEAD: Mild luminal irregularity of the posterior circulation suggests atherosclerosis without hemodynamically significant stenosis nor acute vascular process.  If ongoing concern for acute ischemia, MRI of the brain with diffusion-weighted sequences would be more sensitive.  CTA NECK: No hemodynamically significant stenosis nor acute vascular process.     Ct Head Wo Contrast  03/15/2014   IMPRESSION: 1. No acute intracranial pathology seen on CT. 2. Mild small vessel ischemic microangiopathy.   Ct Head Wo Contrast  03/14/2014    IMPRESSION: Normal head CT.     Ct Angio Neck W/cm &/or  Wo/cm  03/15/2014    IMPRESSION: CTA HEAD: Mild luminal irregularity of the posterior circulation suggests atherosclerosis without hemodynamically significant stenosis nor acute vascular process.  If ongoing concern for acute ischemia, MRI of the brain with diffusion-weighted sequences would be more sensitive.  CTA NECK: No hemodynamically significant stenosis nor acute vascular process.    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 Swelling but no evidence of hemorrhage or shift.   Electronically Signed   By: Paulina Fusi M.D.   On: 03/15/2014 15:49   Physical exam: Gen: pleasant young african american lady not in distress. Eyes: anicteric sclerae, moist conjunctivae                    CV: RRR, no MRG Mental Status: Alert, Not following commands  Neuro  Awake alert oriented x 3 Speech:    No dysarthria, no aphasia  Cranial Nerves:    The pupils are equal, round, and reactive to light. EOMI. Face symmetric. Right  Dense homonymous hemianopia. Conjugate gaze. No gaze preference.   Motor Observation:    no involuntary movements noted.    Strength:    Decreased strength right arm 2/5. Able to move all other extremities anti gravity but right leg appears weaker than left.   Reflexes: brisk throughout  Toes: downgoing     Sensation:     Right-sided mild neglect and diminished touch and pin prick sensation on the right  ASSESSMENT/PLAN  Sharon May is a 43 y.o. female with a history of hypertension, diabetes who presents with right arm weakness  .  She did receive IV t-PA . Imaging confirms acute infarction affecting the complete left posterior cerebral artery  Stroke work up underway. She likely embolic given her young age and no significant vascular occlusion  Stroke  ASA 325  Carotid Doppler pending 2D Echo  EF 40-45% likely due to hypertensive heart disease     LDL  statin necessary as meeting goal of LDL <70 (diabetes)  HgbA1c 8.7  SCDs for VTE prophylaxis  Bedrest  Therapy needs:  PT/OT and speech  Disposition:  pending  Hypertension   Post TPA <180/105 for 24-48 hours and then gradually normalize within 5-7 days.  BP goal long term 130/80  Hyperlipidemia  LDL 105  LDL goal <70 for diabetics  Start Lipitor  qday  Diabetes  Home meds: Metformin  HgbA1c 8.7  Uncontrolled  Goal < 7.0  Will need to educate patient about lifestyle changes for diabetes treatment  Other Active Problems  Elevated Troponin I 0.61 -> 0.42 ->  1.17 -> 1.42. Cardiology consulted.  Hospital day # 3   I personally reviewed images CT of the head , MRI of the brain Echo and lab results.  Delia Heady MD Neuro Stroke   To contact Stroke Continuity provider, please refer to [email or web address]. After hours, contact General Neurology

## 2014-03-17 NOTE — Progress Notes (Signed)
Reviewed treatment session, continue current POC.  Charlotte Crumb, PT DPT  [phone]

## 2014-03-17 NOTE — Progress Notes (Signed)
Rehab Admissions Coordinator Note:  Patient was screened by Stuti Sandin L for appropriateness for an Inpatient Acute Rehab Consult.  At this time, we are recommending Inpatient Rehab consult.  Sani Loiseau L 03/17/2014, 10:09 AM  I can be reached at [phone].

## 2014-03-17 NOTE — Progress Notes (Signed)
  Purpose of TEE, procedural details and risk were explained. Patient's questions were answered. TEE orders have been placed. Will be NPO at midnight. RN will have patient sign consent.   Robbie Lis, PA-C

## 2014-03-17 NOTE — Progress Notes (Signed)
Patient: Sharon May / Admit Date: 03/14/2014 / Date of Encounter: 03/17/2014, 10:08 AM   Subjective: Feeling better. Mild residual memory issues but otherwise feels mostly back to normal.   Objective: Telemetry: NSR Physical Exam: Blood pressure 173/89, pulse 82, temperature 98.8 F (37.1 C), temperature source Oral, resp. rate 25, height 5' 4.17" (1.63 m), weight 160 lb (72.576 kg), SpO2 100.00%. General: Well developed, well nourished AAF in no acute distress. Head: Normocephalic, atraumatic, sclera non-icteric, no xanthomas, nares are without discharge. Neck: Negative for carotid bruits. JVP not elevated. Lungs: Clear bilaterally to auscultation without wheezes, rales, or rhonchi. Breathing is unlabored. Heart: RRR S1 S2 without murmurs, rubs, or gallops.  Abdomen: Soft, non-tender, non-distended with normoactive bowel sounds. No rebound/guarding. Extremities: No clubbing or cyanosis. No edema. Distal pedal pulses are 2+ and equal bilaterally. Neuro: Alert and oriented X 3. Moves all extremities spontaneously. Psych:  Responds to questions appropriately with a normal affect.   Intake/Output Summary (Last 24 hours) at 03/17/14 1008 Last data filed at 03/17/14 0900  Gross per 24 hour  Intake   1445 ml  Output   1150 ml  Net    295 ml    Inpatient Medications:  . aspirin  325 mg Oral Daily  . atenolol  50 mg Oral Daily  . atorvastatin  20 mg Oral q1800  . cefTRIAXone (ROCEPHIN)  IV  1 g Intravenous Q24H  . cloNIDine  0.2 mg Oral BID  . hydrochlorothiazide  25 mg Oral Daily  . insulin aspart  0-15 Units Subcutaneous TID WC  . lisinopril  20 mg Oral Daily  . metFORMIN  500 mg Oral BID WC  . pantoprazole (PROTONIX) IV  40 mg Intravenous QHS   Infusions:    Labs:  Recent Labs  03/14/14 2137 03/16/14 1250  NA 139 139  K 3.1* 3.5*  CL 98 101  CO2 24 23  GLUCOSE 236* 205*  BUN 19 18  CREATININE 1.10 1.12*  CALCIUM 9.7 8.5  MG  --  1.8  PHOS  --  3.2    Recent  Labs  03/14/14 2137  AST 19  ALT 16  ALKPHOS 69  BILITOT 0.3  PROT 8.7*  ALBUMIN 4.2    Recent Labs  03/14/14 2137 03/16/14 1250  WBC 11.3* 13.5*  NEUTROABS 7.2  --   HGB 12.6 12.5  HCT 35.1* 36.2  MCV 81.8 83.6  PLT 311 287    Recent Labs  03/15/14 0248 03/15/14 0956 03/15/14 1713 03/15/14 2334 03/16/14 1250  CKTOTAL  --   --   --  76  --   CKMB  --   --   --  3.9  --   TROPONINI 0.42* 1.17* 1.42*  --  <0.30   No components found with this basename: POCBNP,   Recent Labs  03/15/14 0248  HGBA1C 8.7*     Radiology/Studies:  Ct Angio Head W/cm &/or Wo Cm  03/15/2014   CLINICAL DATA:  Hypertension diabetes, RIGHT foot numbness, RIGHT lip numbness for 1 hr.  EXAM: CT ANGIOGRAPHY HEAD AND NECK  TECHNIQUE: Multidetector CT imaging of the head and neck was performed using the standard protocol during bolus administration of intravenous contrast. Multiplanar CT image reconstructions and MIPs were obtained to evaluate the vascular anatomy. Carotid stenosis measurements (when applicable) are obtained utilizing NASCET criteria, using the distal internal carotid diameter as the denominator.  CONTRAST:  50mL OMNIPAQUE IOHEXOL 350 MG/ML SOLN  COMPARISON:  CT of the head March 15, 2014  FINDINGS: CTA HEAD FINDINGS  Anterior circulation: Normal appearance of the cervical internal carotid arteries, petrous, cavernous and supra clinoid internal carotid arteries. Widely patent anterior communicating artery. Normal appearance of the anterior and middle cerebral arteries.  Posterior circulation: Codominant vertebral arteries with widely patent vertebral arteries, vertebrobasilar junction and basilar artery, as well as main branch vessels. Mild irregularity of the bilateral superior cerebellar arteries and posterior cerebral arteries.  No large vessel occlusion, hemodynamically significant stenosis, dissection, luminal irregularity, contrast extravasation or aneurysm within the anterior nor  posterior circulation.  No abnormal parenchymal or leptomeningeal enhancement.  Review of the MIP images confirms the above findings.  CTA NECK FINDINGS  Normal appearance of the thoracic arch, normal branch pattern. The origins of the innominate, left Common carotid artery and subclavian artery are widely patent.  Bilateral Common carotid arteries are widely patent, coursing in a straight line fashion. 1-2 mm calcific atherosclerosis of the RIGHT carotid bulb with minimal intimal thickening of the LEFT carotid bulb. Otherwise normal appearance of the carotid bifurcations without hemodynamically significant stenosis by NASCET criteria. Normal appearance of the included internal carotid arteries.  Codominant vertebral arteries. Normal appearance of the vertebral arteries, which appear widely patent.  No hemodynamically significant stenosis by NASCET criteria. No dissection, no pseudoaneurysm. No abnormal luminal irregularity. No contrast extravasation.  Soft tissues are nonsuspicious; posterior neck subcutaneous fat stranding and thickening may reflect scarring, subcentimeter radiopaque foreign bodies which could reflect glass. No acute osseous process though bone windows have not been submitted.  Review of the MIP images confirms the above findings.  IMPRESSION: CTA HEAD: Mild luminal irregularity of the posterior circulation suggests atherosclerosis without hemodynamically significant stenosis nor acute vascular process.  If ongoing concern for acute ischemia, MRI of the brain with diffusion-weighted sequences would be more sensitive.  CTA NECK: No hemodynamically significant stenosis nor acute vascular process.  Acute findings discussed with and reconfirmed by Dr.MCNEILL [facility] on 03/15/2014 at 12:55 am.   Electronically Signed   By: Awilda Metro   On: 03/15/2014 01:12   Ct Head Wo Contrast  03/15/2014   CLINICAL DATA:  Stroke, 24 hr after tPA.  EXAM: CT HEAD WITHOUT CONTRAST  TECHNIQUE: Contiguous  axial images were obtained from the base of the skull through the vertex without intravenous contrast.  COMPARISON:  MRI of the head March 15, 2014  FINDINGS: LEFT mesial temporal occipital hypodensity with loss of the gray-white matter differentiation is local sulcal effacement consistent with evolving stroke. No hemorrhagic conversion. Patchy hypodensity and LEFT thalamus consistent with ischemia as seen on prior MRI. No midline shift. Mildly effaced LEFT occipital horn, no hydrocephalus.  No abnormal extra-axial fluid collections. Visualized paranasal sinuses and mastoid air cells are well aerated. Ocular globes and orbital contents are unremarkable.  IMPRESSION: Evolving LEFT posterior cerebral artery territory infarct without hemorrhagic conversion.   Electronically Signed   By: Awilda Metro   On: 03/15/2014 23:37   Ct Head Wo Contrast  03/15/2014   CLINICAL DATA:  Code stroke. Generalized weakness and numbness. Nausea and vomiting.  EXAM: CT HEAD WITHOUT CONTRAST  TECHNIQUE: Contiguous axial images were obtained from the base of the skull through the vertex without intravenous contrast.  COMPARISON:  CT of the head performed 03/14/2014  FINDINGS: There is no evidence of acute infarction, mass lesion, or intra- or extra-axial hemorrhage on CT.  Mild subcortical white matter change likely reflects small vessel ischemic microangiopathy.  The posterior fossa, including the cerebellum, brainstem and  fourth ventricle, is within normal limits. The third and lateral ventricles, and basal ganglia are unremarkable in appearance. The cerebral hemispheres demonstrate grossly normal gray-white differentiation. No mass effect or midline shift is seen.  There is no evidence of fracture; visualized osseous structures are unremarkable in appearance. The orbits are within normal limits. The paranasal sinuses and mastoid air cells are well-aerated. No significant soft tissue abnormalities are seen.  IMPRESSION: 1. No  acute intracranial pathology seen on CT. 2. Mild small vessel ischemic microangiopathy.   Electronically Signed   By: Roanna Raider M.D.   On: 03/15/2014 00:28   Ct Head Wo Contrast  03/14/2014   CLINICAL DATA:  Weakness, numbness.  EXAM: CT HEAD WITHOUT CONTRAST  TECHNIQUE: Contiguous axial images were obtained from the base of the skull through the vertex without intravenous contrast.  COMPARISON:  None.  FINDINGS: No acute intracranial hemorrhage. No focal mass lesion. No CT evidence of acute infarction. No midline shift or mass effect. No hydrocephalus. Basilar cisterns are patent.  Paranasal sinuses and  mastoid air cells are clear.  IMPRESSION: Normal head CT.   Electronically Signed   By: Genevive Bi M.D.   On: 03/14/2014 22:12   Ct Angio Neck W/cm &/or Wo/cm  03/15/2014   CLINICAL DATA:  Hypertension diabetes, RIGHT foot numbness, RIGHT lip numbness for 1 hr.  EXAM: CT ANGIOGRAPHY HEAD AND NECK  TECHNIQUE: Multidetector CT imaging of the head and neck was performed using the standard protocol during bolus administration of intravenous contrast. Multiplanar CT image reconstructions and MIPs were obtained to evaluate the vascular anatomy. Carotid stenosis measurements (when applicable) are obtained utilizing NASCET criteria, using the distal internal carotid diameter as the denominator.  CONTRAST:  50mL OMNIPAQUE IOHEXOL 350 MG/ML SOLN  COMPARISON:  CT of the head March 15, 2014  FINDINGS: CTA HEAD FINDINGS  Anterior circulation: Normal appearance of the cervical internal carotid arteries, petrous, cavernous and supra clinoid internal carotid arteries. Widely patent anterior communicating artery. Normal appearance of the anterior and middle cerebral arteries.  Posterior circulation: Codominant vertebral arteries with widely patent vertebral arteries, vertebrobasilar junction and basilar artery, as well as main branch vessels. Mild irregularity of the bilateral superior cerebellar arteries and  posterior cerebral arteries.  No large vessel occlusion, hemodynamically significant stenosis, dissection, luminal irregularity, contrast extravasation or aneurysm within the anterior nor posterior circulation.  No abnormal parenchymal or leptomeningeal enhancement.  Review of the MIP images confirms the above findings.  CTA NECK FINDINGS  Normal appearance of the thoracic arch, normal branch pattern. The origins of the innominate, left Common carotid artery and subclavian artery are widely patent.  Bilateral Common carotid arteries are widely patent, coursing in a straight line fashion. 1-2 mm calcific atherosclerosis of the RIGHT carotid bulb with minimal intimal thickening of the LEFT carotid bulb. Otherwise normal appearance of the carotid bifurcations without hemodynamically significant stenosis by NASCET criteria. Normal appearance of the included internal carotid arteries.  Codominant vertebral arteries. Normal appearance of the vertebral arteries, which appear widely patent.  No hemodynamically significant stenosis by NASCET criteria. No dissection, no pseudoaneurysm. No abnormal luminal irregularity. No contrast extravasation.  Soft tissues are nonsuspicious; posterior neck subcutaneous fat stranding and thickening may reflect scarring, subcentimeter radiopaque foreign bodies which could reflect glass. No acute osseous process though bone windows have not been submitted.  Review of the MIP images confirms the above findings.  IMPRESSION: CTA HEAD: Mild luminal irregularity of the posterior circulation suggests atherosclerosis without hemodynamically significant stenosis  nor acute vascular process.  If ongoing concern for acute ischemia, MRI of the brain with diffusion-weighted sequences would be more sensitive.  CTA NECK: No hemodynamically significant stenosis nor acute vascular process.  Acute findings discussed with and reconfirmed by Dr.MCNEILL [facility] on 03/15/2014 at 12:55 am.   Electronically  Signed   By: Awilda Metro   On: 03/15/2014 01:12   Mr Brain Wo Contrast  03/15/2014   CLINICAL DATA:  Right-sided weakness.  History of previous stroke.  EXAM: MRI HEAD WITHOUT CONTRAST  TECHNIQUE: Multiplanar, multiecho pulse sequences of the brain and surrounding structures were obtained without intravenous contrast.  COMPARISON:  CT studies earlier same day.  FINDINGS: Diffusion imaging shows acute infarction in the complete left posterior cerebral artery territory distribution, affecting the posterior medial temporal lobe, occipital lobe and lateral thalamus. There is mild swelling but there is no hemorrhage or shift. No other areas of acute infarction are seen.  No old infarctions are discernible. The study is degraded by motion on the latter sequences.  No hydrocephalus, mass lesion or extra-axial collection. No pituitary mass. Flow is present in the major vessels at the base of the brain. Sinuses are clear.  IMPRESSION: Acute infarction affecting the complete left posterior cerebral artery territory as outlined above. Swelling but no evidence of hemorrhage or shift.   Electronically Signed   By: Paulina Fusi M.D.   On: 03/15/2014 15:49   Dg Chest Port 1 View  03/16/2014   CLINICAL DATA:  Elevated white blood cell count  EXAM: PORTABLE CHEST - 1 VIEW  COMPARISON:  Thoracic spine series 09/14/2011  FINDINGS: Stable enlarged cardiac and mediastinal contours. No consolidative pulmonary opacity. No pleural effusion or pneumothorax. Regional skeleton is unremarkable.  IMPRESSION: Cardiomegaly.  No acute cardiopulmonary process.   Electronically Signed   By: Annia Belt M.D.   On: 03/16/2014 18:58     Assessment and Plan  43 y/o F with hx of uncontrolled DM, HTN recently seen in [place] for EF 40-45% admitted with stroke and hypertensive urgency.   1. Acute left PCA CVA s/p tPA  - now on ASA 325mg  daily per neurology 2. Elevated troponin felt due to demand ischemia from accelerated HTN  - no further  workup necessary at present time (see #3)  - she should f/u with primary cardiologist in [facility] 3. NICM EF 40-45% likely due to hypertensive heart disease   - recent negative nuc at [place] 01/2014, echo similar to prior 4. Accelerated HTN with recent reported medication noncompliance - labs historically demonstrate hypokalemia even dating back to 2009 per [facility] thus may need further evaluation for secondary causes of HTN - consider daily KCl if K+ low on f/u BMET tomorrow (received total for K of 3.5 yesterday) - BP remains elevated - consider further titration of BB/lisinopril if OK with neurology 5. Sinus tachycardia, resolved  - likely rebound due to noncompliance with atenolol  - recent TSH 1.29 in 12/2013 per [facility]  Signed, Ronie Spies PA-C Patient seen and examined and history reviewed. Agree with above findings and plan. No chest pain or SOB. BP elevated but improved. Echo shows EF 40-45% with global hypokinesis. This is consistent with prior Echo in [place]. Troponin elevation is due to demand ischemia in setting of acute CVA and HTN. No further cardiac work up needed. We will sign off.   Peter [place], MDFACC 03/17/2014 12:11 PM

## 2014-03-17 NOTE — Progress Notes (Signed)
Occupational Therapy Evaluation Patient Details Name: Sharon May MRN: [id number] DOB: 22-Nov-1970 Today's Date: 03/17/2014    History of Present Illness 43 yo admitted with R sided weakness. +tpa. MRI + L posterior distribution CVA - temporal and occipital and thalamic CVA.    Clinical Impression   PTA, pt independent with ADL and mobility. Pt currently requires Mod A with mobility and ADL. Excellent CIR candidate with very supportive family. Pt will benefit from skilled OT services to facilitate D/C to CIR due to below deficits.    Follow Up Recommendations  CIR;Supervision/Assistance - 24 hour    Equipment Recommendations  3 in 1 bedside comode    Recommendations for Other Services Rehab consult; speech consult     Precautions / Restrictions Precautions Precautions: Fall Precaution Comments: r field cut; r inattention Restrictions Weight Bearing Restrictions: No      Mobility Bed Mobility Overal bed mobility: Needs Assistance Bed Mobility: Supine to Sit     Supine to sit: HOB elevated;Min assist     General bed mobility comments: physical assistance to roll to L and incorporate R side  Transfers Overall transfer level: Needs assistance   Transfers: Sit to/from Stand Sit to Stand: Min assist         General transfer comment: facilitation for midline orientaiton in standing; R bias    Balance Overall balance assessment: Needs assistance Sitting-balance support: Feet supported;Single extremity supported Sitting balance-Leahy Scale: Fair     Standing balance support: Single extremity supported;During functional activity Standing balance-Leahy Scale: Poor Standing balance comment: R bias`                            ADL Overall ADL's : Needs assistance/impaired   Eating/Feeding Details (indicate cue type and reason): will assess Grooming: Moderate assistance;Sitting   Upper Body Bathing: Moderate assistance;Sitting   Lower Body Bathing:  Moderate assistance;Sit to/from stand   Upper Body Dressing : Moderate assistance;Sitting   Lower Body Dressing: Moderate assistance;Sit to/from stand   Toilet Transfer: Moderate assistance;Ambulation;Comfort height toilet   Toileting- Clothing Manipulation and Hygiene: Moderate assistance;Sit to/from stand       Functional mobility during ADLs: Moderate assistance;Cueing for safety;Cueing for sequencing (facilitation for weight shift to left. difficulty with midli) General ADL Comments: began crying when attempting to complete a functional task. Note R inattention and apparent R field cut during tasks.Ambulated to bathroom with facilitation of weight shift to L. vc to clear RLE     Vision Eye Alignment: Within Functional Limits Alignment/Gaze Preference: Gaze left;Head turned (able to turn head to R. Prefers L  gaze.)   Tracking/Visual Pursuits: Decreased smoothness of horizontal tracking;Decreased smoothness of vertical tracking Saccades: Additional eye shifts occurred during testing;Decreased speed of saccadic movement Convergence: Within functional limits  R homonymous hemianopsia   Additional Comments: unable to sustain gaze out of midline   Perception Perception Perception Tested?: Yes Perception Deficits: Inattention/neglect Spatial deficits: R inattention   Praxis Praxis Praxis tested?: Deficits Praxis-Other Comments: will further assess. ? 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 Pt attempting to use R UE during functional activities at times. difficulty with moving RUE on command. More coordianted movement during functional tasks. RUE Sensation: decreased  light touch RUE Coordination: decreased fine motor;decreased gross motor   Lower Extremity Assessment Lower Extremity Assessment: Defer to PT evaluation (vc to advance RLE during ambulation)   Cervical / Trunk Assessment Cervical / Trunk Assessment: Normal   Communication Communication Communication: Expressive difficulties   Cognition Arousal/Alertness: Awake/alert Behavior During Therapy: Flat affect (labile) Overall Cognitive Status: Impaired/Different from baseline Area of Impairment: Orientation;Attention;Following commands;Problem solving;Safety/judgement;Awareness Orientation Level: Disoriented to;Time (unable to state month or year; knew she had CVA) Current Attention Level: Selective   Following Commands: Follows one step commands with increased time (? language componenet) Safety/Judgement: Decreased awareness of safety;Decreased awareness of deficits Awareness: Intellectual Problem Solving: Slow processing;Difficulty sequencing;Requires verbal cues;Requires tactile cues     General Comments       Exercises       Shoulder Instructions      Home Living Family/patient expects to be discharged to:: Private residence Living Arrangements: Children Available Help at Discharge: Family;Available 24 hours/day Type of Home: House Home Access: Stairs to enter [employer] of Steps: 3 Entrance Stairs-Rails: Right;Left Home Layout: One level;Able to live on main level with bedroom/bathroom     Bathroom Shower/Tub: Tub/shower unit         Home Equipment: None   Additional Comments: Has boyfriend who can provide 24/7 S after D/C. Aunt states she has very supportive family.      Prior Functioning/Environment Level of Independence: Independent             OT Diagnosis: Generalized weakness;Cognitive deficits;Disturbance of vision;Hemiplegia dominant side   OT Problem List: Decreased strength;Decreased range of motion;Decreased activity  tolerance;Impaired balance (sitting and/or standing);Impaired vision/perception;Decreased coordination;Decreased cognition;Decreased safety awareness;Decreased knowledge of use of DME or AE;Impaired sensation;Impaired tone;Impaired UE functional use   OT Treatment/Interventions: Self-care/ADL training;Therapeutic exercise;Neuromuscular education;DME and/or AE instruction;Therapeutic activities;Cognitive remediation/compensation;Visual/perceptual remediation/compensation;Patient/family education;Balance training    OT Goals(Current goals can be found in the care plan section) Acute Rehab OT Goals Patient Stated Goal: to get better OT Goal Formulation: With patient Time For Goal Achievement: 03/31/14 Potential to Achieve Goals: Good  OT Frequency: Min 3X/week   Barriers to D/C:            Co-evaluation              End of Session Equipment Utilized During Treatment: Gait belt Nurse Communication: Mobility status  Activity Tolerance: Patient tolerated treatment well Patient left: in bed;with call bell/phone within reach;with family/visitor present;with bed alarm set   Time: 1525-1600 OT Time Calculation (min): 35 min Charges:  OT General Charges $OT Visit: 1 Procedure OT Evaluation $Initial OT Evaluation Tier I: 1 Procedure OT Treatments $Self Care/Home Management : 23-37 mins G-Codes:    Norvil Martensen,HILLARY 2014-03-29, 4:36 PM   [facility], OTR/L  [phone] 03/29/14

## 2014-03-17 NOTE — Progress Notes (Signed)
Physical Therapy Treatment Patient Details Name: Sharon May MRN: [id number] DOB: August 12, 1970 Today's Date: 03/17/2014    History of Present Illness Admitted to [facility] ED withc/o R upper lip numbness and R foot tightness.  Symptoms evolved to R upper and lower extremity weakness..  Transported to [facility] for tPa.  Work up continues.  MRI shows posterior distribution stroke.    PT Comments    Pt requires mod A +2 with RW and multimodal cueing for ambulation and transfer. Gait parameters improve during gait training with facilitation of weight shift to L and cueing to maintain more upright posture.  Pt with inattention of R side, requires assistance for UE placement during transfers and ambulation. Will see as indicated and progress as tolerated.   Follow Up Recommendations  CIR     Equipment Recommendations  Other (comment) (TBA)    Recommendations for Other Services Rehab consult     Precautions / Restrictions Precautions Precautions: Fall Restrictions Weight Bearing Restrictions: No    Mobility  Bed Mobility Overal bed mobility: Needs Assistance Bed Mobility: Supine to Sit     Supine to sit: Min assist     General bed mobility comments: trunk assist to get to EOB  Transfers Overall transfer level: Needs assistance Equipment used: Rolling walker (2 wheeled) Transfers: Sit to/from Stand Sit to Stand: Mod assist         General transfer comment: VC for technique with tactile cue for R UE placement.   Ambulation/Gait Ambulation/Gait assistance: Mod assist;+2 physical assistance Ambulation Distance (Feet): 30 Feet Assistive device: Rolling walker (2 wheeled) Gait Pattern/deviations: Decreased stride length;Decreased weight shift to left;Ataxic;Step-through pattern Gait velocity: slow   General Gait Details: Pt demonstrates ataxia on R with lean to R. Pt requires A to maintain grip with R on walker. R step length improves with assistance for weight shifting to  L.  VC provided throughout gait for technique.    Stairs            Wheelchair Mobility    Modified Rankin (Stroke Patients Only)       Balance Overall balance assessment: Needs assistance Sitting-balance support: Feet supported;No upper extremity supported Sitting balance-Leahy Scale: Fair Sitting balance - Comments: pt requires cueing for UE support. Pt reports mild dizziness upon initial sitting EOB.   Standing balance support: Bilateral upper extremity supported Standing balance-Leahy Scale: Poor Standing balance comment: Pt demonstrate R lean, A provided to correct. Pt require A for hand placement on walker.                    Cognition Arousal/Alertness: Awake/alert Behavior During Therapy: WFL for tasks assessed/performed Overall Cognitive Status: Impaired/Different from baseline     Current Attention Level: Selective   Following Commands: Follows one step commands with increased time     Problem Solving: Slow processing;Decreased initiation      Exercises Other Exercises Other Exercises: Seated Lean to R with pushing into R UE to facilitate proprioceptive input to R UE    General Comments General comments (skin integrity, edema, etc.): Pt provide education about prognosis and diagnosis. Pt instructed to keep R UE and LE moving as much as possible. Pt demonstrates some R side inattention during treatment and requires cueing to pay attention to R side. Pt notes some confusion with some VC provided. During seated leaning to R exercise, pt requires VC and tactile cues with physical A to maintain R hand in contact with bed. BP 171/90 prior to start  of treatment, 159/93 at end of treatment. Discussed with pt benefits of CIR.      Pertinent Vitals/Pain Pain Assessment: No/denies pain    Home Living                      Prior Function            PT Goals (current goals can now be found in the care plan section) Acute Rehab PT Goals PT Goal  Formulation: With patient/family Time For Goal Achievement: 03/30/14 Potential to Achieve Goals: Good Progress towards PT goals: Progressing toward goals    Frequency  Min 3X/week    PT Plan      Co-evaluation             End of Session   Activity Tolerance: Patient tolerated treatment well Patient left: in chair;with call bell/phone within reach;with family/visitor present     Time: 1610-9604 PT Time Calculation (min): 30 min  Charges:                       G Codes:      Sharon May 03/17/2014, 12:47 PM Sharon May, SPT

## 2014-03-18 ENCOUNTER — Encounter (HOSPITAL_COMMUNITY)
Admission: EM | Disposition: A | Discharge status: Rehabilitation Facility | Payer: Self-pay | Source: Home / Self Care | Attending: Neurology

## 2014-03-18 ENCOUNTER — Encounter (HOSPITAL_COMMUNITY): Payer: Self-pay | Admitting: Gastroenterology

## 2014-03-18 DIAGNOSIS — I059 Rheumatic mitral valve disease, unspecified: Secondary | ICD-10-CM

## 2014-03-18 DIAGNOSIS — I6330 Cerebral infarction due to thrombosis of unspecified cerebral artery: Secondary | ICD-10-CM

## 2014-03-18 HISTORY — PX: TEE WITHOUT CARDIOVERSION: SHX5443

## 2014-03-18 LAB — SEDIMENTATION RATE: Sed Rate: 42 mm/hr — ABNORMAL HIGH (ref 0–22)

## 2014-03-18 LAB — URINE CULTURE: SPECIAL REQUESTS: ELEVATED

## 2014-03-18 LAB — BASIC METABOLIC PANEL
Anion gap: 16 — ABNORMAL HIGH (ref 5–15)
BUN: 24 mg/dL — ABNORMAL HIGH (ref 6–23)
CO2: 22 mEq/L (ref 19–32)
Calcium: 9 mg/dL (ref 8.4–10.5)
Chloride: 96 mEq/L (ref 96–112)
Creatinine, Ser: 1.08 mg/dL (ref 0.50–1.10)
GFR calc Af Amer: 72 mL/min — ABNORMAL LOW (ref >90)
GFR calc non Af Amer: 62 mL/min — ABNORMAL LOW (ref >90)
Glucose, Bld: 175 mg/dL — ABNORMAL HIGH (ref 70–99)
Potassium: 3.6 mEq/L — ABNORMAL LOW (ref 3.7–5.3)
Sodium: 134 mEq/L — ABNORMAL LOW (ref 137–147)

## 2014-03-18 LAB — GLUCOSE, CAPILLARY
GLUCOSE-CAPILLARY: 165 mg/dL — AB (ref 70–99)
GLUCOSE-CAPILLARY: 151 mg/dL — AB (ref 70–99)
GLUCOSE-CAPILLARY: 145 mg/dL — AB (ref 70–99)
Glucose-Capillary: 189 mg/dL — ABNORMAL HIGH (ref 70–99)
Glucose-Capillary: 171 mg/dL — ABNORMAL HIGH (ref 70–99)

## 2014-03-18 SURGERY — ECHOCARDIOGRAM, TRANSESOPHAGEAL
Anesthesia: Moderate Sedation

## 2014-03-18 MED ORDER — BUTAMBEN-TETRACAINE-BENZOCAINE 2-2-14 % EX AERO
INHALATION_SPRAY | CUTANEOUS | Status: DC | PRN
  Administered 2014-03-18: 2 via TOPICAL

## 2014-03-18 MED ORDER — AMLODIPINE BESYLATE 5 MG PO TABS
5.0000 mg | ORAL_TABLET | Freq: Every day | ORAL | Status: DC
  Administered 2014-03-18 – 2014-03-20 (×3): 5 mg via ORAL
  Filled 2014-03-18 (×3): qty 1

## 2014-03-18 MED ORDER — CLONIDINE HCL 0.1 MG PO TABS
0.2000 mg | ORAL_TABLET | Freq: Every day | ORAL | Status: DC
  Administered 2014-03-19: 0.2 mg via ORAL
  Filled 2014-03-18: qty 2

## 2014-03-18 MED ORDER — MIDAZOLAM HCL 5 MG/ML IJ SOLN
INTRAMUSCULAR | Status: AC
  Filled 2014-03-18: qty 2

## 2014-03-18 MED ORDER — WHITE PETROLATUM GEL
Status: AC
  Administered 2014-03-18: 03:00:00
  Filled 2014-03-18: qty 5

## 2014-03-18 MED ORDER — ENOXAPARIN SODIUM 40 MG/0.4ML ~~LOC~~ SOLN
40.0000 mg | SUBCUTANEOUS | Status: DC
  Administered 2014-03-18 – 2014-03-20 (×3): 40 mg via SUBCUTANEOUS
  Filled 2014-03-18 (×3): qty 0.4

## 2014-03-18 MED ORDER — MIDAZOLAM HCL 10 MG/2ML IJ SOLN
INTRAMUSCULAR | Status: DC | PRN
  Administered 2014-03-18: 1 mg via INTRAVENOUS
  Administered 2014-03-18: 2 mg via INTRAVENOUS
  Administered 2014-03-18: 1 mg via INTRAVENOUS

## 2014-03-18 MED ORDER — DIPHENHYDRAMINE HCL 50 MG/ML IJ SOLN
INTRAMUSCULAR | Status: AC
  Filled 2014-03-18: qty 1

## 2014-03-18 MED ORDER — FENTANYL CITRATE 0.05 MG/ML IJ SOLN
INTRAMUSCULAR | Status: AC
  Filled 2014-03-18: qty 2

## 2014-03-18 MED ORDER — LIDOCAINE VISCOUS 2 % MT SOLN
OROMUCOSAL | Status: AC
  Filled 2014-03-18: qty 15

## 2014-03-18 MED ORDER — DIPHENHYDRAMINE HCL 50 MG/ML IJ SOLN
INTRAMUSCULAR | Status: DC | PRN
  Administered 2014-03-18: 25 mg via INTRAVENOUS

## 2014-03-18 NOTE — CV Procedure (Signed)
TRANSESOPHAGEAL ECHOCARDIOGRAM (TEE) NOTE  INDICATIONS: stroke  PROCEDURE:   Informed consent was obtained prior to the procedure. The risks, benefits and alternatives for the procedure were discussed and the patient comprehended these risks.  Risks include, but are not limited to, cough, sore throat, vomiting, nausea, somnolence, esophageal and stomach trauma or perforation, bleeding, low blood pressure, aspiration, pneumonia, infection, trauma to the teeth and death.    After a procedural time-out, the patient was given 5 mg versed and 25 mg IV benadryl for moderate sedation.  Fentanyl was not given due to the patient's morphine allergy. The oropharynx was anesthetized 3 topical cetacaine sprays. The transesophageal probe was inserted in the esophagus and stomach without difficulty and multiple views were obtained.  The patient was kept under observation until the patient left the procedure room.  The patient left the procedure room in stable condition.   Agitated microbubble saline contrast was administered.  COMPLICATIONS:    There were no immediate complications.  Findings:  1. LEFT VENTRICLE: The left ventricular wall thickness is moderately increased.  The left ventricular cavity is normal in size. Wall motion is globally hypokinetic.  LVEF is 40-45%.  2. RIGHT VENTRICLE:  The right ventricle is normal in structure and function without any thrombus or masses.    3. LEFT ATRIUM:  The left atrium is moderately dilated in size without any thrombus or masses.  There is not spontaneous echo contrast ("smoke") in the left atrium consistent with a low flow state.  4. LEFT ATRIAL APPENDAGE:  The left atrial appendage is free of any thrombus or masses. The appendage has single lobes. Pulse doppler indicates moderate flow in the appendage.  5. ATRIAL SEPTUM:  The atrial septum appears intact and is free of thrombus and/or masses.  There is no evidence for interatrial shunting by color  doppler and saline microbubble.  6. RIGHT ATRIUM:  The right atrium is normal in size and function without any thrombus or masses.  7. MITRAL VALVE:  The mitral valve is normal in structure and function with Mild regurgitation.  There were no vegetations or stenosis.  8. AORTIC VALVE:  The aortic valve is trileaflet, normal in structure and function with no regurgitation.  There were no vegetations or stenosis  9. TRICUSPID VALVE:  The tricuspid valve is normal in structure and function with Mild regurgitation.  There were no vegetations or stenosis  10.  PULMONIC VALVE:  The pulmonic valve is normal in structure and function with no regurgitation.  There were no vegetations or stenosis.   11. AORTIC ARCH, ASCENDING AND DESCENDING AORTA:  There was no obviously atherosclerosis of the ascending aorta, aortic arch, or proximal descending aorta, however, limited views were obtained.  12. PULMONARY VEINS: Not evaluated.  13. PERICARDIUM: The pericardium appeared normal and non-thickened.  There is no pericardial effusion.  IMPRESSION:   1. No LAA thrombus 2. Bubble study negative for PFO 3. Mild MR, TR 4. LVEF 40-45%, moderate global hypokinesis.  RECOMMENDATIONS:    1.  Will need improved blood pressure control going forward. Family reports clonidine has been very sedating for her and she has not been compliant with this medication. I would look for alternatives if possible or move clonidine to QHS dosing. Also consider work-up for secondary hypertension, specifically plasma Renin:Aldosterone ratio or consider empiric spironolactone. Would also benefit from CCB such as amlodipine.  Time Spent Directly with the Patient:  45 minutes   Chrystie Nose, MD, [facility] Attending Cardiologist  [facility]  03/18/2014, 8:47 AM

## 2014-03-18 NOTE — Progress Notes (Signed)
Dr Rennis Golden aware of elevated BP

## 2014-03-18 NOTE — H&P (Signed)
     INTERVAL PROCEDURE H&P  History and Physical Interval Note:  03/18/2014 7:47 AM  Sharon May has presented today for their planned procedure. The various methods of treatment have been discussed with the patient and family. After consideration of risks, benefits and other options for treatment, the patient has consented to the procedure.  The patients' outpatient history has been reviewed, patient examined, and no change in status from most recent office note within the past 30 days. I have reviewed the patients' chart and labs and will proceed as planned. Questions were answered to the patient's satisfaction.   Chrystie Nose, MD, [facility] Attending Cardiologist [facility]  Cassell Voorhies C 03/18/2014, 7:47 AM

## 2014-03-18 NOTE — Progress Notes (Signed)
UR completed. Trell Secrist RN CCM Case Mgmt 

## 2014-03-18 NOTE — Evaluation (Signed)
SLP Cancellation Note  Patient Details Name: Sharon May MRN: [id number] DOB: 10/25/1970   Cancelled treatment:       Reason Eval/Treat Not Completed: Other (comment) (MD cancelled order, please reorder if desire.)   Donavan Burnet, MS [facility] SLP [phone]

## 2014-03-18 NOTE — Consult Note (Signed)
Physical Medicine and Rehabilitation Consult Reason for Consult: CVA Referring Physician: [facility]   HPI: Sharon May is a 43 y.o. right-handed female with history of hypertension as well as diabetes mellitus with peripheral neuropathy and medical noncompliance. Presented 03/15/2014 with right side weakness and blurred vision presenting [facility] the [facility] in [facility]. Blood pressure markedly elevated at 264/118 and placed on Cardene drip. She was transferred to [facility] for ongoing evaluation. Initial cranial CT scan negative. TPA was administered. MRI of the brain showed acute infarction affecting the complete left posterior cerebral artery territory. Echocardiogram with ejection fraction of 45% grade 1 diastolic dysfunction. CT and head and neck unremarkable. Findings of mildly elevated troponin with EKG showing sinus tachycardia with nonspecific T-wave abnormalities in the lateral leads. Cardiology services consulted with TEE pending. Elevated troponin felt to be demand ischemia accelerated hypertension. Neurology services consulted placed on aspirin for CVA prophylaxis. Urine culture greater than 100,000 Proteus maintained on Rocephin. Physical and occupational therapy evaluations completed with recommendations of physical medicine rehabilitation consult.   Review of Systems  Eyes: Positive for blurred vision.  Gastrointestinal: Positive for constipation.  Neurological: Positive for weakness.  All other systems reviewed and are negative.  Past Medical History  Diagnosis Date  . Hypertension   . Diabetes mellitus    Past Surgical History  Procedure Laterality Date  . Cyst removed    . Tonsillectomy     History reviewed. No pertinent family history. Social History:  reports that she has never smoked. She does not have any smokeless tobacco history on file. She reports that she does not drink alcohol or use illicit drugs. Allergies:  Allergies  Allergen Reactions   . Morphine And Related Itching   Medications Prior to Admission  Medication Sig Dispense Refill  . atenolol (TENORMIN) 50 MG tablet Take 50 mg by mouth daily.      . cloNIDine (CATAPRES) 0.2 MG tablet Take 0.2 mg by mouth 2 (two) times daily.      . hydrochlorothiazide (HYDRODIURIL) 25 MG tablet Take 25 mg by mouth daily.      Marland Kitchen HYDROcodone-acetaminophen (NORCO) 10-325 MG per tablet Take 0.5-1 tablets by mouth every 4 (four) hours as needed (for pain; may cause constipation).  30 tablet  0  . lisinopril (PRINIVIL,ZESTRIL) 20 MG tablet Take 20 mg by mouth daily.      . metFORMIN (GLUCOPHAGE) 500 MG tablet Take 500 mg by mouth 2 (two) times daily with a meal.      . promethazine (PHENERGAN) 25 MG tablet Take 1 tablet (25 mg total) by mouth every 6 (six) hours as needed for nausea or vomiting.  20 tablet  0    Home: Home Living Family/patient expects to be discharged to:: Private residence Living Arrangements: Children Available Help at Discharge: Family;Available 24 hours/day Type of Home: House Home Access: Stairs to enter [employer] of Steps: 3 Entrance Stairs-Rails: Right;Left Home Layout: One level;Able to live on main level with bedroom/bathroom Home Equipment: None Additional Comments: Has boyfriend who can provide 24/7 S after D/C. Aunt states she has very supportive family.  Functional History: Prior Function Level of Independence: Independent Functional Status:  Mobility: Bed Mobility Overal bed mobility: Needs Assistance Bed Mobility: Supine to Sit Supine to sit: HOB elevated;Min assist General bed mobility comments: physical assistance to roll to L and attend to R side Transfers Overall transfer level: Needs assistance Equipment used: Rolling walker (2 wheeled) Transfers: Sit to/from Stand Sit  to Stand: Min assist General transfer comment: facilitation for midline orientaiton in standing Ambulation/Gait Ambulation/Gait assistance: Mod assist;+2  physical assistance Ambulation Distance (Feet): 30 Feet Assistive device: Rolling walker (2 wheeled) Gait Pattern/deviations: Decreased stride length;Decreased weight shift to left;Ataxic;Step-through pattern Gait velocity: slow General Gait Details: Pt demonstrates ataxia on R with lean to R. Pt requires A to maintain grip with R on walker. R step length improves with assistance for weight shifting to L.  VC provided throughout gait for technique.     ADL: ADL Overall ADL's : Needs assistance/impaired Eating/Feeding Details (indicate cue type and reason): will assess Grooming: Moderate assistance;Sitting Upper Body Bathing: Moderate assistance;Sitting Lower Body Bathing: Moderate assistance;Sit to/from stand Upper Body Dressing : Moderate assistance;Sitting Lower Body Dressing: Moderate assistance;Sit to/from stand Toilet Transfer: Moderate assistance;Ambulation;Comfort height toilet Toileting- Clothing Manipulation and Hygiene: Moderate assistance;Sit to/from stand Functional mobility during ADLs: Moderate assistance;Cueing for safety;Cueing for sequencing (facilitation for weight shift to left. difficulty with midli) General ADL Comments: began crying when attempting to complete a functional task. Note R inattention and apparent R field cut during tasks.Ambulated to bathroom with facilitation of weight shift to L. vc to clear RLE  Cognition: Cognition Overall Cognitive Status: Impaired/Different from baseline Orientation Level: Oriented to person;Oriented to place;Oriented to situation;Disoriented to time Cognition Arousal/Alertness: Awake/alert Behavior During Therapy: Flat affect (labile) Overall Cognitive Status: Impaired/Different from baseline Area of Impairment: Orientation;Attention;Following commands;Problem solving;Safety/judgement;Awareness Orientation Level: Disoriented to;Time (unable to state month or year; knew she had CVA) Current Attention Level:  Selective Following Commands: Follows one step commands with increased time (? language componenet) Safety/Judgement: Decreased awareness of safety;Decreased awareness of deficits Awareness: Intellectual Problem Solving: Slow processing;Difficulty sequencing;Requires verbal cues;Requires tactile cues  Blood pressure 185/90, pulse 62, temperature 98.6 F (37 C), temperature source Oral, resp. rate 18, height 5' 4.17" (1.63 m), weight 72.576 kg (160 lb), SpO2 100.00%. Physical Exam  Constitutional: She appears well-developed.  HENT:  Head: Normocephalic.  Mouth/Throat: No oropharyngeal exudate.  Eyes: EOM are normal. Right eye exhibits no discharge. Left eye exhibits no discharge.  Neck: Normal range of motion. Neck supple. No JVD present. No tracheal deviation present. No thyromegaly present.  Cardiovascular: Normal rate and regular rhythm.   Respiratory: Effort normal and breath sounds normal. No respiratory distress.  GI: Soft. Bowel sounds are normal. She exhibits no distension.  Musculoskeletal: She exhibits no edema.  Neurological: She is alert.  Alert.  She makes eye contact with examiner. She was oriented to person, place and date of birth. Follows simple commands. Cn exam without focal deficits. Tracks to all fields. No diplopia. Sensation 1/2 RUEand RLE. Right PD. Strength 4/5 RUE and RLE. 4+ to 5/5 LUE and LLE. Basic insight and awareness.   Skin: Skin is warm and dry.  Psychiatric:  anxious    Results for orders placed during the hospital encounter of 03/14/14 (from the past 24 hour(s))  GLUCOSE, CAPILLARY     Status: Abnormal   Collection Time    03/17/14  7:45 AM      Result Value Ref Range   Glucose-Capillary 176 (*) 70 - 99 mg/dL   Comment 1 Notify RN     Comment 2 Documented in Chart    GLUCOSE, CAPILLARY     Status: Abnormal   Collection Time    03/17/14  1:37 PM      Result Value Ref Range   Glucose-Capillary 166 (*) 70 - 99 mg/dL   Comment 1 Notify RN  Comment 2 Documented in Chart    GLUCOSE, CAPILLARY     Status: Abnormal   Collection Time    03/17/14  9:13 PM      Result Value Ref Range   Glucose-Capillary 166 (*) 70 - 99 mg/dL   Comment 1 Notify RN     Comment 2 Documented in Chart     Dg Chest Port 1 View  03/16/2014   CLINICAL DATA:  Elevated white blood cell count  EXAM: PORTABLE CHEST - 1 VIEW  COMPARISON:  Thoracic spine series 09/14/2011  FINDINGS: Stable enlarged cardiac and mediastinal contours. No consolidative pulmonary opacity. No pleural effusion or pneumothorax. Regional skeleton is unremarkable.  IMPRESSION: Cardiomegaly.  No acute cardiopulmonary process.   Electronically Signed   By: Annia Belt M.D.   On: 03/16/2014 18:58    Assessment/Plan: Diagnosis: left PCA infarct 1. Does the need for close, 24 hr/day medical supervision in concert with the patient's rehab needs make it unreasonable for this patient to be served in a less intensive setting? Yes 2. Co-Morbidities requiring supervision/potential complications: htn 3. Due to bladder management, bowel management, safety, skin/wound care, disease management, medication administration and patient education, does the patient require 24 hr/day rehab nursing? Yes 4. Does the patient require coordinated care of a physician, rehab nurse, PT (1-2 hrs/day, 5 days/week) and OT (1-2 hrs/day, 5 days/week) to address physical and functional deficits in the context of the above medical diagnosis(es)? Yes Addressing deficits in the following areas: balance, endurance, locomotion, strength, transferring, bowel/bladder control, bathing, dressing, feeding, grooming, toileting and psychosocial support 5. Can the patient actively participate in an intensive therapy program of at least 3 hrs of therapy per day at least 5 days per week? Yes 6. The potential for patient to make measurable gains while on inpatient rehab is excellent 7. Anticipated functional outcomes upon discharge from  inpatient rehab are modified independent and supervision  with PT, modified independent and supervision with OT, n/a with SLP. 8. Estimated rehab length of stay to reach the above functional goals is: 10-14 days 9. Does the patient have adequate social supports to accommodate these discharge functional goals? Potentially 10. Anticipated D/C setting: Home 11. Anticipated post D/C treatments: HH therapy and Outpatient therapy 12. Overall Rehab/Functional Prognosis: good  RECOMMENDATIONS: This patient's condition is appropriate for continued rehabilitative care in the following setting: CIR Patient has agreed to participate in recommended program. Yes Note that insurance prior authorization may be required for reimbursement for recommended care.  Comment: Rehab Admissions Coordinator to follow up.  Thanks,  Ranelle Oyster, MD, [place] [facility]     03/18/2014

## 2014-03-18 NOTE — Progress Notes (Signed)
Echocardiogram Echocardiogram Transesophageal has been performed.  Dorothey Baseman 03/18/2014, 8:58 AM

## 2014-03-18 NOTE — Progress Notes (Signed)
VASCULAR LAB PRELIMINARY  PRELIMINARY  PRELIMINARY  PRELIMINARY  Carotid duplex completed.    Preliminary report:  Bilateral:  1-39% ICA stenosis.  Vertebral artery flow is antegrade.     Donnielle Addison, RVS 03/18/2014, 12:06 PM

## 2014-03-18 NOTE — Progress Notes (Signed)
STROKE TEAM PROGRESS NOTE   HISTORY Sharon May is a 43 y.o. female with a history of hypertension, diabetes who presents with right arm weakness that started earlier tonight. When she initially arrived to [place] this complaint, she had very mild symptoms with some mild discoordination of her right hand, but otherwise no symptoms including visual problems. A code stroke was activated, but given that she did not have significantly disabling symptoms initially, she was getting a head CT, etc at [facility] in anticipation of admittion. During workup, however, she had progressive worsening, and therefore a code stroke was re-activated and she was transported to [facility] to receive tPA.  Here, it was noted that she also had developed a hemianopia and left gaze preference.  LKW: 8:30 PM  tpa given?: yes   She was admitted to the neuro ICU for further evaluation and treatment.   SUBJECTIVE (INTERVAL HISTORY) Family at the bedside. awaiting rehab approval.  OBJECTIVE Temp:  [98.1 F (36.7 C)-99.1 F (37.3 C)] 98.1 F (36.7 C) (09/22 1013) Pulse Rate:  [62-91] 79 (09/22 0850) Cardiac Rhythm:  [-]  Resp:  [8-23] 18 (09/22 1013) BP: (163-260)/(80-116) 183/80 mmHg (09/22 1013) SpO2:  [98 %-100 %] 100 % (09/22 1013)   Recent Labs Lab 03/17/14 1337 03/17/14 2113 03/18/14 0657 03/18/14 0744 03/18/14 1136  GLUCAP 166* 166* 189* 171* 165*    Recent Labs Lab 03/14/14 2137 03/16/14 1250 03/18/14 0600  NA 139 139 134*  K 3.1* 3.5* 3.6*  CL 98 101 96  CO2 24 23 22   GLUCOSE 236* 205* 175*  BUN 19 18 24*  CREATININE 1.10 1.12* 1.08  CALCIUM 9.7 8.5 9.0  MG  --  1.8  --   PHOS  --  3.2  --     Recent Labs Lab 03/14/14 2137  AST 19  ALT 16  ALKPHOS 69  BILITOT 0.3  PROT 8.7*  ALBUMIN 4.2    Recent Labs Lab 03/14/14 2137 03/16/14 1250  WBC 11.3* 13.5*  NEUTROABS 7.2  --   HGB 12.6 12.5  HCT 35.1* 36.2  MCV 81.8 83.6  PLT 311 287    Recent Labs Lab  03/14/14 2137 03/15/14 0248 03/15/14 0956 03/15/14 1713 03/15/14 2334 03/16/14 1250  CKTOTAL  --   --   --   --  76  --   CKMB  --   --   --   --  3.9  --   TROPONINI 0.61* 0.42* 1.17* 1.42*  --  <0.30   No results found for this basename: LABPROT, INR,  in the last 72 hours  Recent Labs  03/16/14 1659  COLORURINE YELLOW  LABSPEC 1.018  PHURINE 7.0  GLUCOSEU 100*  HGBUR LARGE*  BILIRUBINUR NEGATIVE  KETONESUR NEGATIVE  PROTEINUR 30*  UROBILINOGEN 0.2  NITRITE NEGATIVE  LEUKOCYTESUR LARGE*       Component Value Date/Time   CHOL 165 03/15/2014 0249   TRIG 125 03/15/2014 0249   HDL 35* 03/15/2014 0249   CHOLHDL 4.7 03/15/2014 0249   VLDL 25 03/15/2014 0249   LDLCALC 105* 03/15/2014 0249   Lab Results  Component Value Date   HGBA1C 8.7* 03/15/2014      Component Value Date/Time   LABOPIA NONE DETECTED 03/14/2014 2215   COCAINSCRNUR NONE DETECTED 03/14/2014 2215   LABBENZ NONE DETECTED 03/14/2014 2215   AMPHETMU NONE DETECTED 03/14/2014 2215   THCU NONE DETECTED 03/14/2014 2215   LABBARB NONE DETECTED 03/14/2014 2215     Recent Labs  Lab 03/14/14 2137  ETH <11    Ct Head Wo Contrast 03/15/2014  1. No acute intracranial pathology seen on CT. 2. Mild small vessel ischemic microangiopathy.  03/14/2014  Normal head CT.     Ct Angio Head W/cm &/or Wo Cm 03/15/2014  Mild luminal irregularity of the posterior circulation suggests atherosclerosis without hemodynamically significant stenosis nor acute vascular process.  If ongoing concern for acute ischemia, MRI of the brain with diffusion-weighted sequences would be more sensitive.  Ct Angio Neck W/cm &/or Wo/cm 03/15/2014     No hemodynamically significant stenosis nor acute vascular process.    Mr Brain Wo Contrast 03/15/2014    Acute infarction affecting the complete left posterior cerebral artery territory as outlined above. Swelling but no evidence of hemorrhage or shift.      Physical exam: Gen: pleasant young african  american lady not in distress. Eyes: anicteric sclerae, moist conjunctivae                    CV: RRR, no MRG Mental Status: Alert, Not following commands Neuro  Awake alert oriented x 3 Speech:    No dysarthria, no aphasia Cranial Nerves:    The pupils are equal, round, and reactive to light. EOMI. Face symmetric. Right  Dense homonymous hemianopia. Conjugate gaze. No gaze preference.  Motor Observation:    no involuntary movements noted.   Strength:    Decreased strength right arm 2/5. Able to move all other extremities anti gravity but right leg appears weaker than left.  Reflexes: brisk throughout Toes: downgoing    Sensation:     Right-sided mild neglect and diminished touch and pin prick sensation on the right   ASSESSMENT/PLAN Sharon May is a 43 y.o. female with a history of hypertension, diabetes who presents with right arm weakness.  She did receive IV t-PA . Imaging confirms acute infarction affecting the complete left posterior cerebral artery. She likely embolic given her young age and no significant vascular occlusion  Stroke: L PCA infarct, embolic secondary to unknown source  On ASA 325 Carotid Doppler  No evidence of hemodynamically significant internal carotid artery stenosis. Vertebral artery flow is antegrade.   2D Echo -  EF 40-45% likely due to hypertensive heart disease   TEE - No LAA thrombus. Bubble study negative for PFO. Mild MR, TR. LVEF 40-45%, moderate global hypokinesis. Given her young age, will check hypercoagulable tests (lupus anticoagulant, cardiolipin antibody) and vasculitic labs (C3, C4, CH50, ANA, ESR) as well as HIV and RPR as possible stroke sources. Change SCDs to lovenox  Therapy needs:  CIR. reordered speech  Disposition:  CIR consult pending  Hypertension, accelerated  In setting of medication noncompliance due to sedation on clonidine BP 154-260/80-105 past 24h (03/18/2014 @ 12:06 PM)  Cardiology recommends changing clonidine  to hs, add amlodipine and do workup for secondary hypertension (plasma renin:aldosterone ratio or consider empiric sprionolactone). Will add norvasc, change clonidine to hs and draw lab.   BP goal long term 130/80  Hyperlipidemia  LDL 105  LDL goal <70 for diabetics  Started Lipitor 32m qday  Diabetes  Home meds: Metformin  HgbA1c 8.7  Uncontrolled  Goal < 7.0  educate patient about lifestyle changes for diabetes treatment  Other Active Problems  Elevated Troponin I 0.61 -> 0.42 -> 1.17 -> 1.42. Cardiology consulted. Due to demand ischemia from accelerated HTN. No further workup.  Sinus tachycardia, resolved. Due to rebound d/t noncompliance with atenolol. Recent TSH 1.29 12/2013.  Hospital day # 4  Burnetta Sabin, MSN, RN, ANVP-BC, ANP-BC, [facility] Pager: [phone] 03/18/2014 12:01 PM  I have personally examined this patient, reviewed notes, independently viewed imaging studies, participated in medical decision making and plan of care. I have made any additions or clarifications directly to the above note. Agree with note above.  Antony Contras, MD Medical Director [facility] Pager: [phone] 03/18/2014 2:52 PM   To contact Stroke Continuity provider, please refer to [email or web address] After hours, contact General Neurology

## 2014-03-19 ENCOUNTER — Encounter (HOSPITAL_COMMUNITY): Payer: Self-pay | Admitting: Internal Medicine

## 2014-03-19 DIAGNOSIS — IMO0002 Reserved for concepts with insufficient information to code with codable children: Secondary | ICD-10-CM | POA: Diagnosis present

## 2014-03-19 DIAGNOSIS — E1165 Type 2 diabetes mellitus with hyperglycemia: Secondary | ICD-10-CM | POA: Diagnosis present

## 2014-03-19 DIAGNOSIS — E785 Hyperlipidemia, unspecified: Secondary | ICD-10-CM | POA: Diagnosis present

## 2014-03-19 LAB — C3 COMPLEMENT: C3 COMPLEMENT: 144 mg/dL (ref 90–180)

## 2014-03-19 LAB — GLUCOSE, CAPILLARY
GLUCOSE-CAPILLARY: 148 mg/dL — AB (ref 70–99)
GLUCOSE-CAPILLARY: 154 mg/dL — AB (ref 70–99)
Glucose-Capillary: 122 mg/dL — ABNORMAL HIGH (ref 70–99)
Glucose-Capillary: 140 mg/dL — ABNORMAL HIGH (ref 70–99)

## 2014-03-19 LAB — LUPUS ANTICOAGULANT PANEL
DRVVT: 40.2 s (ref <42.9)
LUPUS ANTICOAGULANT: NOT DETECTED
PTT Lupus Anticoagulant: 30.3 secs (ref 28.0–43.0)

## 2014-03-19 LAB — HIV ANTIBODY (ROUTINE TESTING W REFLEX): HIV: NONREACTIVE

## 2014-03-19 LAB — ANTI-NUCLEAR AB-TITER (ANA TITER): ANA Titer 1: NEGATIVE

## 2014-03-19 LAB — RPR

## 2014-03-19 LAB — ANA: Anti Nuclear Antibody(ANA): POSITIVE — AB

## 2014-03-19 LAB — C4 COMPLEMENT: Complement C4, Body Fluid: 41 mg/dL — ABNORMAL HIGH (ref 10–40)

## 2014-03-19 MED ORDER — ONDANSETRON HCL 4 MG/2ML IJ SOLN
4.0000 mg | Freq: Four times a day (QID) | INTRAMUSCULAR | Status: DC | PRN
  Administered 2014-03-19 (×2): 4 mg via INTRAVENOUS
  Filled 2014-03-19 (×2): qty 2

## 2014-03-19 NOTE — Care Management Note (Addendum)
  Page 1 of 1   03/20/2014     11:23:03 AM CARE MANAGEMENT NOTE 03/20/2014  Patient:  Sharon May, Sharon May   Account Number:  [id number]  Date Initiated:  03/18/2014  Documentation initiated by:  Lorne Skeens  Subjective/Objective Assessment:   Patient was admitted with CVA. Lives at home with children.     Action/Plan:   Will follow for discharge needs pending PT/OT evals and physician orders.   Anticipated DC Date:     Anticipated DC Plan:  IP REHAB FACILITY  In-house referral  Clinical Social Worker      DC Planning Services  CM consult      Choice offered to / List presented to:             Status of service:  In process, will continue to follow Medicare Important Message given?   (If response is "NO", the following Medicare IM given date fields will be blank) Date Medicare IM given:   Medicare IM given by:   Date Additional Medicare IM given:   Additional Medicare IM given by:    Discharge Disposition:    Per UR Regulation:  Reviewed for med. necessity/level of care/duration of stay  If discussed at [place] of Stay Meetings, dates discussed:    Comments:  03/20/14 [place], MSN, CM- Discharge summary faxed to [facility] inpatient rehab for discharge today.  [facility] is being notified of need for transport prior to 1300 today.   03/19/14 [place], MSN, CM- Met with patient and mother to discuss alternate discharge plan in the event that CIR does not have beds available.  Initially, patient and mother were resistant to seeking alternate options, as they felt they were promised a bed.  CM explained that all patient's recommended for CIR must have an alternate plan, as beds are often limited.  It was also explained that patient's are not able to stay in the hospital waiting for a bed if there is no medical necessity.  Patient and mother verbalized understanding and were in agreement to being faxed to [facility].  Janine  with CIR is aware that patient is still hoping to be considered.

## 2014-03-19 NOTE — Progress Notes (Signed)
Pt c/o of headache and tylenol tabs offered but pt refused it after going to the bathroom,said to be feeling better, tylenol tab wasted,pt reassured,family at bedside, will continue to monitor. [place], Mindie Rawdon Efe

## 2014-03-19 NOTE — Evaluation (Signed)
Speech Language Pathology Evaluation Patient Details Name: Sharon May MRN: [id number] DOB: 08-12-1970 Today's Date: 03/19/2014 Time: 0912-0930 SLP Time Calculation (min): 18 min  Problem List:  Patient Active Problem List   Diagnosis Date Noted  . NICM (nonischemic cardiomyopathy) 03/17/2014  . Accelerated hypertension 03/17/2014  . Sinus tachycardia 03/17/2014  . Demand ischemia 03/17/2014  . Stroke 03/15/2014   Past Medical History:  Past Medical History  Diagnosis Date  . Hypertension   . Diabetes mellitus    Past Surgical History:  Past Surgical History  Procedure Laterality Date  . Cyst removed    . Tonsillectomy    . Tee without cardioversion N/A 03/18/2014    Procedure: TRANSESOPHAGEAL ECHOCARDIOGRAM (TEE);  Surgeon: Chrystie Nose, MD;  Location: [facility] ENDOSCOPY;  Service: Cardiovascular;  Laterality: N/A;   HPI:  Sharon May is a 43 y.o. female with a history of hypertension, diabetes who presents with right arm weakness.  She did receive IV t-PA . Imaging confirms acute infarction affecting the complete left posterior cerebral artery.    Assessment / Plan / Recommendation Clinical Impression  Pt presents with moderate cognitive deficits in areas of attention, memory and awareness impactiing ability to comprehend multistep commands, learn new information and compensate for right inattention. Pt will need f/u SLP therapy to address deficits, recommend CIR at d/c.     SLP Assessment  Patient needs continued Speech Lanaguage Pathology Services    Follow Up Recommendations  Inpatient Rehab    Frequency and Duration min 2x/week  2 weeks   Pertinent Vitals/Pain Pain Assessment: No/denies pain   SLP Goals  Potential to Achieve Goals: Good  SLP Evaluation Prior Functioning  Cognitive/Linguistic Baseline: Within functional limits Type of Home: House Available Help at Discharge: Family;Available 24 hours/day   Cognition  Overall Cognitive Status:  Impaired/Different from baseline Arousal/Alertness: Awake/alert Orientation Level: Oriented to person;Oriented to situation;Disoriented to time;Oriented to place Attention: Focused;Sustained Focused Attention: Appears intact Sustained Attention: Impaired Sustained Attention Impairment: Verbal basic;Functional basic Memory: Impaired Memory Impairment: Retrieval deficit;Storage deficit Awareness: Impaired Awareness Impairment: Intellectual impairment;Emergent impairment Problem Solving: Impaired Problem Solving Impairment: Verbal basic;Functional basic Behaviors: Lability;Poor frustration tolerance Safety/Judgment: Impaired    Comprehension  Auditory Comprehension Overall Auditory Comprehension: Impaired Yes/No Questions: Not tested Commands: Impaired One Step Basic Commands: 75-100% accurate Two Step Basic Commands: 50-74% accurate Multistep Basic Commands: 0-24% accurate Conversation: Simple Interfering Components: Attention    Expression Verbal Expression Overall Verbal Expression: Appears within functional limits for tasks assessed Pragmatics: Impairment Impairments: Abnormal affect Written Expression Dominant Hand: Right   Oral / Motor Oral Motor/Sensory Function Overall Oral Motor/Sensory Function: Appears within functional limits for tasks assessed Motor Speech Overall Motor Speech: Appears within functional limits for tasks assessed   GO    Harlon Ditty, MA CCC-SLP [phone]  Claudine Mouton 03/19/2014, 10:47 AM

## 2014-03-19 NOTE — Progress Notes (Signed)
STROKE TEAM PROGRESS NOTE   HISTORY Sharon May is a 43 y.o. female with a history of hypertension, diabetes who presents with right arm weakness that started earlier tonight. When she initially arrived to [place] this complaint, she had very mild symptoms with some mild discoordination of her right hand, but otherwise no symptoms including visual problems. A code stroke was activated, but given that she did not have significantly disabling symptoms initially, she was getting a head CT, etc at [facility] in anticipation of admittion. During workup, however, she had progressive worsening, and therefore a code stroke was re-activated and she was transported to [facility] to receive tPA.  Here, it was noted that she also had developed a hemianopia and left gaze preference.  LKW: 8:30 PM  tpa given?: yes   She was admitted to the neuro ICU for further evaluation and treatment.   SUBJECTIVE (INTERVAL HISTORY) Patient medically waiting on CIR bed.   OBJECTIVE Temp:  [97.9 F (36.6 C)-98.5 F (36.9 C)] 98.2 F (36.8 C) (09/23 0626) Pulse Rate:  [64-91] 66 (09/23 0626) Cardiac Rhythm:  [-]  Resp:  [8-20] 18 (09/23 0626) BP: (136-260)/(67-116) 198/79 mmHg (09/23 0626) SpO2:  [98 %-100 %] 100 % (09/23 0626)   Recent Labs Lab 03/18/14 0744 03/18/14 1136 03/18/14 1627 03/18/14 2109 03/19/14 0628  GLUCAP 171* 165* 151* 145* 122*    Recent Labs Lab 03/14/14 2137 03/16/14 1250 03/18/14 0600  NA 139 139 134*  K 3.1* 3.5* 3.6*  CL 98 101 96  CO2 _0 GLUCOSE 236* 205* 175*  BUN 19 18 24*  CREATININE 1.10 1.12* 1.08  CALCIUM 9.7 8.5 9.0  MG  --  1.8  --   PHOS  --  3.2  --     Recent Labs Lab 03/14/14 2137  AST 19  ALT 16  ALKPHOS 69  BILITOT 0.3  PROT 8.7*  ALBUMIN 4.2    Recent Labs Lab 03/14/14 2137 03/16/14 1250  WBC 11.3* 13.5*  NEUTROABS 7.2  --   HGB 12.6 12.5  HCT 35.1* 36.2  MCV 81.8 83.6  PLT 311 287    Recent Labs Lab 03/14/14 2137  03/15/14 0248 03/15/14 0956 03/15/14 1713 03/15/14 2334 03/16/14 1250  CKTOTAL  --   --   --   --  76  --   CKMB  --   --   --   --  3.9  --   TROPONINI 0.61* 0.42* 1.17* 1.42*  --  <0.30   No results found for this basename: LABPROT, INR,  in the last 72 hours  Recent Labs  03/16/14 1659  COLORURINE YELLOW  LABSPEC 1.018  PHURINE 7.0  GLUCOSEU 100*  HGBUR LARGE*  BILIRUBINUR NEGATIVE  KETONESUR NEGATIVE  PROTEINUR 30*  UROBILINOGEN 0.2  NITRITE NEGATIVE  LEUKOCYTESUR LARGE*       Component Value Date/Time   CHOL 165 03/15/2014 0249   TRIG 125 03/15/2014 0249   HDL 35* 03/15/2014 0249   CHOLHDL 4.7 03/15/2014 0249   VLDL 25 03/15/2014 0249   LDLCALC 105* 03/15/2014 0249   Lab Results  Component Value Date   HGBA1C 8.7* 03/15/2014      Component Value Date/Time   LABOPIA NONE DETECTED 03/14/2014 2215   COCAINSCRNUR NONE DETECTED 03/14/2014 2215   LABBENZ NONE DETECTED 03/14/2014 2215   AMPHETMU NONE DETECTED 03/14/2014 2215   THCU NONE DETECTED 03/14/2014 2215   LABBARB NONE DETECTED 03/14/2014 2215     Recent Labs  Lab 03/14/14 2137  ETH <11    Ct Head Wo Contrast 03/15/2014  1. No acute intracranial pathology seen on CT. 2. Mild small vessel ischemic microangiopathy.  03/14/2014  Normal head CT.     Ct Angio Head W/cm &/or Wo Cm 03/15/2014  Mild luminal irregularity of the posterior circulation suggests atherosclerosis without hemodynamically significant stenosis nor acute vascular process.  If ongoing concern for acute ischemia, MRI of the brain with diffusion-weighted sequences would be more sensitive.  Ct Angio Neck W/cm &/or Wo/cm 03/15/2014     No hemodynamically significant stenosis nor acute vascular process.    Mr Brain Wo Contrast 03/15/2014    Acute infarction affecting the complete left posterior cerebral artery territory as outlined above. Swelling but no evidence of hemorrhage or shift.     Carotid Doppler  No evidence of hemodynamically significant  internal carotid artery stenosis. Vertebral artery flow is antegrade.   2D Echo -  EF 40-45% likely due to hypertensive heart disease   TEE - No LAA thrombus. Bubble study negative for PFO. Mild MR, TR. LVEF 40-45%, moderate global hypokinesis.   Physical exam: Gen: pleasant young african american lady not in distress. Eyes: anicteric sclerae, moist conjunctivae                    CV: RRR, no MRG Mental Status: Alert, Not following commands Neuro  Awake alert oriented x 3 Speech:    No dysarthria, no aphasia Cranial Nerves:    The pupils are equal, round, and reactive to light. EOMI. Face symmetric. Right  Dense homonymous hemianopia. Conjugate gaze. No gaze preference.  Motor Observation:    no involuntary movements noted. Strength:    Decreased strength right arm 2/5. Able to move all other extremities anti gravity but right leg appears weaker than left.  Reflexes: brisk throughout Toes: downgoing Sensation:     Right-sided mild neglect and diminished touch and pin prick sensation on the right   ASSESSMENT/PLAN Sharon May is a 43 y.o. female with a history of hypertension, diabetes who presents with right arm weakness.  She did receive IV t-PA . Imaging confirms acute infarction affecting the complete left posterior cerebral artery. She likely embolic given her young age and no significant vascular occlusion.  Stroke: L PCA infarct, embolic secondary to unknown source  On ASA 325 Carotid Doppler  No evidence of hemodynamically significant internal carotid artery stenosis. Vertebral artery flow is antegrade.   2D Echo -  EF 40-45% likely due to hypertensive heart disease   TEE - No LAA thrombus. Bubble study negative for PFO. Mild MR, TR. LVEF 40-45%, moderate global hypokinesis. Ischemic Hypercoagulable & Vasculitic Workup Normal - C3, C4, RPR, HIV Pending - CH50, ANA, lupus anticoagulant, cardiolipin antibody ESR 42 On lovenox for VTE prophy  Therapy needs:  CIR.    Disposition:  CIR, admission coordinator following. No bed available. Consider SNF placement.   Hypertension, accelerated  In setting of medication noncompliance due to sedation on clonidine BP 136-260/67-105 past 24h (03/19/2014 @ 7:52 AM) Per Cardiology recommendations, changed clonidine to hs, added amlodipine and ordered workup for secondary hypertension (plasma renin:aldosterone ratio, pending).  Received prn labetalol once yesterday  BP goal long term 130/80  Hyperlipidemia  LDL 105  LDL goal <70 for diabetics  Started Lipitor 20mg qday  Diabetes  Home meds: Metformin  HgbA1c 8.7  Uncontrolled  Goal < 7.0  educated patient about lifestyle changes for diabetes treatment  Other Active Problems    Elevated Troponin I 0.61 -> 0.42 -> 1.17 -> 1.42. Cardiology consulted. Due to demand ischemia from accelerated HTN. No further workup.  Sinus tachycardia, resolved. Due to rebound d/t noncompliance with atenolol. Recent TSH 1.29 12/2013.  Hospital day # 5  I have personally examined this patient, reviewed notes, independently viewed imaging studies, participated in medical decision making and plan of care. I have made any additions or clarifications directly to the above note. Agree with note above. Inpatient rehab bed is not available today but patient is medically stable for discharge to rehab. Antony Contras, MD Medical Director [facility] Pager: [phone] 03/19/2014 3:09 PM    To contact Stroke Continuity provider, please refer to [email or web address] After hours, contact General Neurology

## 2014-03-19 NOTE — Progress Notes (Signed)
Physical Therapy Treatment Patient Details Name: Sharon May MRN: [id number] DOB: [birth year] Today's Date: 03/19/2014    History of Present Illness 43 yo admitted with R sided weakness. +tpa. MRI + L posterior distribution CVA - temporal and occipital and thalamic CVA.     PT Comments    Rx limited today by increased BP. BP in sitting is 189/90 and pt with c/o nausea.  Addressed reaching across midline in sitting EOB and proprioceptive control of RLE in sitting.  Min assist needed to maintain midline in sitting without UE support.  Pelvis used as control point for weight shifting and engagement of trunk muscles.  Follow Up Recommendations  CIR     Equipment Recommendations  Other (comment) (TBD)    Recommendations for Other Services Rehab consult     Precautions / Restrictions Precautions Precautions: Fall Precaution Comments: R field cut    Mobility  Bed Mobility Overal bed mobility: Needs Assistance Bed Mobility: Supine to Sit     Supine to sit: Min guard     General bed mobility comments: increased time to complete and continuous verbal/tactile cues  Transfers Overall transfer level: Needs assistance Equipment used: 1 person hand held assist Transfers: Sit to/from [employer] Sit to Stand: Min assist Stand pivot transfers: Min assist       General transfer comment: stand pivot toward left side; verbal cues for sequencing  Ambulation/Gait                 Stairs            Wheelchair Mobility    Modified Rankin (Stroke Patients Only)       Balance Overall balance assessment: Needs assistance Sitting-balance support: Bilateral upper extremity supported;Feet supported Sitting balance-Leahy Scale: Fair     Standing balance support: Bilateral upper extremity supported;During functional activity Standing balance-Leahy Scale: Poor                      Cognition Arousal/Alertness: Awake/alert Behavior During  Therapy: Impulsive Overall Cognitive Status: Impaired/Different from baseline Area of Impairment: Attention;Memory;Following commands;Safety/judgement;Awareness;Problem solving Orientation Level: Time;Situation Current Attention Level: Sustained Memory: Decreased recall of precautions;Decreased short-term memory Following Commands: Follows one step commands with increased time Safety/Judgement: Decreased awareness of safety;Decreased awareness of deficits Awareness: Intellectual Problem Solving: Slow processing;Difficulty sequencing;Requires verbal cues General Comments: Pt labile and tearful during Rx. She asked repetitive questions during Rx.  With any sensation to RUE/LE she exclaimed "Oh, it's cold!"    Exercises      General Comments        Pertinent Vitals/Pain Pain Assessment: No/denies pain    Home Living     Available Help at Discharge: Family;Available 24 hours/day Type of Home: House              Prior Function            PT Goals (current goals can now be found in the care plan section) Acute Rehab PT Goals Patient Stated Goal: to get better Progress towards PT goals: Progressing toward goals    Frequency  Min 3X/week    PT Plan Current plan remains appropriate    Co-evaluation             End of Session Equipment Utilized During Treatment: Gait belt Activity Tolerance: Treatment limited secondary to medical complications (Comment) (increased BP/nausea) Patient left: in chair;with family/visitor present;with call bell/phone within reach;with chair alarm set     Time: 4098-1191 PT Time Calculation (min):  23 min  Charges:  $Therapeutic Activity: 23-37 mins                    G Codes:      Ilda Foil 03/19/2014, 12:10 PM

## 2014-03-19 NOTE — Discharge Summary (Signed)
Stroke Discharge Summary  Patient ID: Sharon May   MRN: [id number]      DOB: [birth year]  Date of Admission: 03/14/2014 Date of Discharge: 03/20/2014  Attending Physician:  Antony Contras, MD, Stroke MD  Consulting Physician(s):    Dr. Fransico Him (cardiology). 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  Past Medical History  Diagnosis Date  . Hypertension   . Diabetes mellitus    Past Surgical History  Procedure Laterality Date  . Cyst removed    . Tonsillectomy    . Tee without cardioversion N/A 03/18/2014    Procedure: TRANSESOPHAGEAL ECHOCARDIOGRAM (TEE);  Surgeon: Pixie Casino, MD;  Location: [facility] ENDOSCOPY;  Service: Cardiovascular;  Laterality: N/A;    Medications to be continued on Rehab   Medication List         amLODipine 5 MG tablet  Commonly known as:  NORVASC  Take 1 tablet (5 mg total) by mouth daily.     aspirin 325 MG tablet  Take 1 tablet (325 mg total) by mouth daily.     atenolol 50 MG tablet  Commonly known as:  TENORMIN  Take 50 mg by mouth daily.     atorvastatin 20 MG tablet  Commonly known as:  LIPITOR  Take 1 tablet (20 mg total) by mouth daily at 6 PM.     cloNIDine 0.2 MG tablet  Commonly known as:  CATAPRES  Take 0.2 mg by mouth 2 (two) times daily.     hydrochlorothiazide 25 MG tablet  Commonly known as:  HYDRODIURIL  Take 25 mg by mouth daily.     HYDROcodone-acetaminophen 10-325 MG per tablet  Commonly known as:  NORCO  Take 0.5-1 tablets by mouth every 4 (four) hours as needed (for pain; may cause  constipation).     lisinopril 20 MG tablet  Commonly known as:  PRINIVIL,ZESTRIL  Take 20 mg by mouth daily.     metFORMIN 500 MG tablet  Commonly known as:  GLUCOPHAGE  Take 500 mg by mouth 2 (two) times daily with a meal.     promethazine 25 MG tablet  Commonly known as:  PHENERGAN  Take 1 tablet (25 mg total) by mouth every 6 (six) hours as needed for nausea or vomiting.        LABORATORY STUDIES CBC    Component Value Date/Time   WBC 13.5* 03/16/2014 1250   RBC 4.33 03/16/2014 1250   HGB 12.5 03/16/2014 1250   HCT 36.2 03/16/2014 1250   PLT 287 03/16/2014 1250   MCV 83.6 03/16/2014 1250   MCH 28.9 03/16/2014 1250   MCHC 34.5 03/16/2014 1250   RDW 12.4 03/16/2014 1250   LYMPHSABS 3.2 03/14/2014 2137   MONOABS 0.8 03/14/2014 2137   EOSABS 0.1 03/14/2014 2137   BASOSABS 0.0 03/14/2014 2137   CMP    Component Value Date/Time   NA 134* 03/18/2014 0600   K 3.6* 03/18/2014 0600   CL 96 03/18/2014 0600   CO2 22 03/18/2014 0600   GLUCOSE 175* 03/18/2014 0600   BUN 24* 03/18/2014  0600   CREATININE 1.08 03/18/2014 0600   CALCIUM 9.0 03/18/2014 0600   PROT 8.7* 03/14/2014 2137   ALBUMIN 4.2 03/14/2014 2137   AST 19 03/14/2014 2137   ALT 16 03/14/2014 2137   ALKPHOS 69 03/14/2014 2137   BILITOT 0.3 03/14/2014 2137   GFRNONAA 62* 03/18/2014 0600   GFRAA 72* 03/18/2014 0600   COAGS Lab Results  Component Value Date   INR 0.97 03/14/2014   Lipid Panel    Component Value Date/Time   CHOL 165 03/15/2014 0249   TRIG 125 03/15/2014 0249   HDL 35* 03/15/2014 0249   CHOLHDL 4.7 03/15/2014 0249   VLDL 25 03/15/2014 0249   LDLCALC 105* 03/15/2014 0249   HgbA1C  Lab Results  Component Value Date   HGBA1C 8.7* 03/15/2014   Cardiac Panel (last 3 results) No results found for this basename: CKTOTAL, CKMB, TROPONINI, RELINDX,  in the last 72 hours Urinalysis    Component Value Date/Time   COLORURINE YELLOW 03/16/2014 1659   APPEARANCEUR CLOUDY* 03/16/2014 1659   LABSPEC 1.018 03/16/2014 1659    PHURINE 7.0 03/16/2014 1659   GLUCOSEU 100* 03/16/2014 1659   HGBUR LARGE* 03/16/2014 1659   BILIRUBINUR NEGATIVE 03/16/2014 1659   KETONESUR NEGATIVE 03/16/2014 1659   PROTEINUR 30* 03/16/2014 1659   UROBILINOGEN 0.2 03/16/2014 1659   NITRITE NEGATIVE 03/16/2014 1659   LEUKOCYTESUR LARGE* 03/16/2014 1659   Urine Drug Screen     Component Value Date/Time   LABOPIA NONE DETECTED 03/14/2014 2215   COCAINSCRNUR NONE DETECTED 03/14/2014 2215   LABBENZ NONE DETECTED 03/14/2014 2215   AMPHETMU NONE DETECTED 03/14/2014 2215   THCU NONE DETECTED 03/14/2014 2215   LABBARB NONE DETECTED 03/14/2014 2215    Alcohol Level    Component Value Date/Time   [facility] <11 03/14/2014 2137     SIGNIFICANT DIAGNOSTIC STUDIES Ct Head Wo Contrast  03/15/2014 1. No acute intracranial pathology seen on CT. 2. Mild small vessel ischemic microangiopathy.  03/14/2014 Normal head CT.  Ct Angio Head W/cm &/or Wo Cm  03/15/2014 Mild luminal irregularity of the posterior circulation suggests atherosclerosis without hemodynamically significant stenosis nor acute vascular process. If ongoing concern for acute ischemia, MRI of the brain with diffusion-weighted sequences would be more sensitive.  Ct Angio Neck W/cm &/or Wo/cm  03/15/2014 No hemodynamically significant stenosis nor acute vascular process.  Mr Brain Wo Contrast  03/15/2014 Acute infarction affecting the complete left posterior cerebral artery territory as outlined above. Swelling but no evidence of hemorrhage or shift.  Carotid Doppler No evidence of hemodynamically significant internal carotid artery stenosis. Vertebral artery flow is antegrade.  2D Echo - EF 40-45% likely due to hypertensive heart disease  TEE - No LAA thrombus. Bubble study negative for PFO. Mild MR, TR. LVEF 40-45%, moderate global hypokinesis.    HISTORY OF PRESENT ILLNES Sharon May is a 43 y.o. female with a history of hypertension, diabetes who presents with right arm weakness that started  earlier tonight 03/14/2014 zt 830p. When she initially arrived to [facility] with this complaint, she had very mild symptoms with some mild discoordination of her right hand, but otherwise no symptoms including visual problems. A code stroke was activated, but given that she did not have significantly disabling symptoms initially, she was getting a head CT, etc at [facility] in anticipation of admittion. During workup, however, she had progressive worsening, and therefore a code stroke was re-activated and she was transported to [facility] to receive tPA. Here,  it was noted that she also had developed a hemianopia and left gaze preference. tpa was given 03/15/2014 at [place]. She was admitted to the ICU for further evaluation and treatment.    HOSPITAL COURSE Sharon May is a 43 y.o. female with a history of hypertension, diabetes who presents with right arm weakness. She did receive IV t-PA . Imaging confirms acute infarction affecting the complete left posterior cerebral artery. She likely embolic given her young age and no significant vascular occlusion. Etiology was not determined.   Stroke: L PCA infarct, embolic secondary to unknown source  Discharge on ASA 325 mg dialy Carotid Doppler No significant stenosis 2D Echo - EF 40-45% likely due to hypertensive heart disease  TEE - No LAA thrombus. Bubble study negative for PFO. Mild MR, TR. LVEF 40-45%, moderate global hypokinesis. Ischemic Hypercoagulable & Vasculitic Workup negative thus far.  Normal - C3, C4, RPR, HIV   Pending - CH50, ANA, lupus anticoagulant, cardiolipin antibody   ESR 42  Therapy recommendations: CIR.  Disposition: CIR, admission coordinator following, No bed available at [facility]. Transfer to [facility] unit for ongoing rehab  Hypertension, accelerated  In setting of medication noncompliance prior to admission (due to sedation on clonidine) BP improving, but remains elevated.  Per Cardiology  recommendations, changed clonidine to hs, added amlodipine and ordered workup for secondary hypertension (plasma renin:aldosterone ratio, pending). May need to increase norvasc dose based on future BPs. BP goal long term: normotensive  Hyperlipidemia  LDL 105  LDL goal <70 for diabetics  Started Lipitor 38m every day. Continue at discharge.  Diabetes  Home meds: Metformin  HgbA1c 8.7  Uncontrolled  Goal < 7.0  educated patient about lifestyle changes for diabetes treatment  Other Active Problems  Elevated Troponin I 0.61 -> 0.42 -> 1.17 -> 1.42. Cardiology consulted. Elevation due to demand ischemia from accelerated HTN. No further workup.  Sinus tachycardia, resolved. Due to rebound d/t noncompliance with atenolol. Recent TSH 1.29 12/2013. Proteus mirabilis UTI, treated with 5 days rocephin   DISCHARGE EXAM Blood pressure 171/82, pulse 68, temperature 98.2 F (36.8 C), temperature source Oral, resp. rate 16, height 5' 4.17" (1.63 m), weight 72.576 kg (160 lb), SpO2 99.00%.  Gen: pleasant young african american lady not in distress.  Eyes: anicteric sclerae, moist conjunctivae  CV: RRR, no MRG  Mental Status: Alert, Not following commands  Neuro  Awake alert oriented x 3  Speech:  No dysarthria, no aphasia  Cranial Nerves:  The pupils are equal, round, and reactive to light. EOMI. Face symmetric. Right Dense homonymous hemianopia. Conjugate gaze. No gaze preference.  Motor Observation:  no involuntary movements noted.  Strength:  Decreased strength right arm 2/5. Able to move all other extremities anti gravity but right leg appears weaker than left.  Reflexes: brisk throughout  Toes: downgoing  Sensation:  Right-sided mild neglect and diminished touch and pin prick sensation on the right  Discharge Diet    heart healthy/carb modified thin liquids  DISCHARGE PLAN  [facility]:  [facility]  aspirin 325 mg orally  every day for secondary stroke prevention.  Follow up hypercoagulable lab result  Monitor BP. Consider increasing norvasc if needed.  Recommend ongoing risk factor control by Primary Care Physician at time of discharge from inpatient rehabilitation.  Follow-up Pcp Not In System in 2 weeks following discharge from rehab.  [facility]  40 minutes  were spent preparing discharge.  Burnetta Sabin, MSN, RN, ANVP-BC, ANP-BC, [facility] Pager: [phone] 03/20/2014 8:40 AM   Signed Antony Contras, MD Medical Director [facility] Pager: [phone] 03/20/2014 3:34 PM

## 2014-03-19 NOTE — Progress Notes (Addendum)
Rehab admissions - I met with pt and her Aunt in follow up to rehab MD consult and explained the possibility of inpatient rehab. Questions were answered and informational brochures were given.  Unfortunately, we do not have a rehab bed available today. Loma Sousa, case manager is aware of this as well as Raquel Sarna with social work. Possible referral was made to [facility].  I updated pt and her Aunt that we are not able to admit her today because we do not have an open bed. They do not want skilled nursing and said they would take pt home if inpatient rehab is not an option. They are willing to consider [facility].  I will continue to follow pt's case in the event she remains in house and our bed availability situation changes.  Please call me with any questions. Thanks.  Nanetta Batty, PT Rehabilitation Admissions Coordinator [phone]

## 2014-03-19 NOTE — Progress Notes (Signed)
Occupational Therapy Treatment Patient Details Name: Sharon May MRN: [id number] DOB: 05-20-1971 Today's Date: 03/19/2014    History of present illness 43 yo admitted with R sided weakness. +tpa. MRI + L posterior distribution CVA - temporal and occipital and thalamic CVA.    OT comments  Pt progressing toward goals and needs cues for Rt visual attention. Pt completed full ADL this session for safety and implusive behavior deficits. OT to follow acutely for adl retraining.    Follow Up Recommendations  CIR;Supervision/Assistance - 24 hour    Equipment Recommendations  3 in 1 bedside comode    Recommendations for Other Services Rehab consult    Precautions / Restrictions Precautions Precautions: Fall Precaution Comments: r field cut       Mobility Bed Mobility Overal bed mobility: Needs Assistance Bed Mobility: Supine to Sit     Supine to sit: Min guard     General bed mobility comments: incr time and cues for safety wtih RT UE  Transfers Overall transfer level: Needs assistance Equipment used: Rolling walker (2 wheeled) Transfers: Sit to/from Stand Sit to Stand: Min assist         General transfer comment: cues for RT UE    Balance Overall balance assessment: Needs assistance Sitting-balance support: Bilateral upper extremity supported;Feet supported Sitting balance-Leahy Scale: Fair     Standing balance support: Bilateral upper extremity supported;During functional activity Standing balance-Leahy Scale: Poor                     ADL Overall ADL's : Needs assistance/impaired     Grooming: Wash/dry face;Oral care;Moderate assistance;Sitting Grooming Details (indicate cue type and reason): cues for sequence, pt putting tooth paste in mouth to loose top and stabilized in rt hand with cues. Pt needed (A) to apply tooth paste Upper Body Bathing: Moderate assistance;Sitting Upper Body Bathing Details (indicate cue type and reason): cues to wash bil  sides Lower Body Bathing: Maximal assistance;Sit to/from stand Lower Body Bathing Details (indicate cue type and reason): (A) for posterior peri care and cues to wash front peri Upper Body Dressing : Minimal assistance;Sitting   Lower Body Dressing: Minimal assistance;Sit to/from stand Lower Body Dressing Details (indicate cue type and reason): able to don socks with cues and incr time Toilet Transfer: Minimal assistance;BSC;RW Toilet Transfer Details (indicate cue type and reason): cues for safety         Functional mobility during ADLs: Moderate assistance;Rolling walker General ADL Comments: Pt demonstrates visual deficits and cues for safety. Pt attempting to complete sit<>Stand once during session by herself. Pt unaware of Rt Ue and needs constant cues for safety and visual attention. pt sitting on RT UE at one point in session and completely unaware.       Vision                 Additional Comments: Rt inattention   Perception     Praxis      Cognition   Behavior During Therapy: Impulsive Overall Cognitive Status: Impaired/Different from baseline Area of Impairment: Attention;Memory;Following commands;Safety/judgement;Awareness;Problem solving   Current Attention Level: Sustained Memory: Decreased recall of precautions;Decreased short-term memory  Following Commands: Follows one step commands with increased time Safety/Judgement: Decreased awareness of safety;Decreased awareness of deficits Awareness: Intellectual Problem Solving: Slow processing;Difficulty sequencing General Comments: pt became liable at beginnnig of session with attempts to move and sensation to the Rt side of the body. pt states "i dont like it" Pt required redirection to task to  terminate liable status. Pt asking repetitive questions. pt poor recall of sequence and instructions. Pt following one step commands. pt unaware aunt present in room to the right side. Pt states put me close to my aunt.  pt needed cues to turn head to visually attend    Extremity/Trunk Assessment               Exercises     Shoulder Instructions       General Comments      Pertinent Vitals/ Pain       Pain Assessment: No/denies pain  Home Living                                          Prior Functioning/Environment              Frequency Min 3X/week     Progress Toward Goals  OT Goals(current goals can now be found in the care plan section)  Progress towards OT goals: Progressing toward goals  Acute Rehab OT Goals Patient Stated Goal: to get better OT Goal Formulation: With patient Time For Goal Achievement: 03/31/14 Potential to Achieve Goals: Good  Plan Discharge plan remains appropriate    Co-evaluation                 End of Session Equipment Utilized During Treatment: Gait belt   Activity Tolerance Patient tolerated treatment well   Patient Left in chair;with call bell/phone within reach;with chair alarm set;with family/visitor present   Nurse Communication Mobility status;Precautions        Time: 0263-7858 OT Time Calculation (min): 31 min  Charges: OT General Charges $OT Visit: 1 Procedure OT Treatments $Self Care/Home Management : 23-37 mins  Harolyn Rutherford 03/19/2014, 9:35 AM Pager: [phone]

## 2014-03-20 DIAGNOSIS — I6350 Cerebral infarction due to unspecified occlusion or stenosis of unspecified cerebral artery: Secondary | ICD-10-CM

## 2014-03-20 DIAGNOSIS — N390 Urinary tract infection, site not specified: Secondary | ICD-10-CM

## 2014-03-20 LAB — GLUCOSE, CAPILLARY
GLUCOSE-CAPILLARY: 160 mg/dL — AB (ref 70–99)
Glucose-Capillary: 154 mg/dL — ABNORMAL HIGH (ref 70–99)

## 2014-03-20 LAB — COMPLEMENT, TOTAL: Compl, Total (CH50): >60 U/mL — ABNORMAL HIGH (ref 31–60)

## 2014-03-20 MED ORDER — ATORVASTATIN CALCIUM 20 MG PO TABS: 20.0000 mg | ORAL_TABLET | Freq: Every day | ORAL | Status: DC

## 2014-03-20 MED ORDER — DEXTROSE 5 % IV SOLN
1.0000 g | INTRAVENOUS | Status: DC
  Administered 2014-03-20: 1 g via INTRAVENOUS
  Filled 2014-03-20 (×2): qty 10

## 2014-03-20 MED ORDER — ASPIRIN 325 MG PO TABS: 325.0000 mg | ORAL_TABLET | Freq: Every day | ORAL | Status: DC

## 2014-03-20 MED ORDER — AMLODIPINE BESYLATE 5 MG PO TABS
5.0000 mg | ORAL_TABLET | Freq: Every day | ORAL | Status: DC
Start: 2014-03-20 — End: 2014-05-17

## 2014-03-20 NOTE — Progress Notes (Signed)
Rehab admissions - We do not have an available bed in our unit today. I received update from [facility] case manager that pt has been accepted at [facility]. I will now sign off pt's case. Thanks.  Juliann Mule, PT Rehabilitation Admissions Coordinator [phone]

## 2014-03-20 NOTE — Progress Notes (Signed)
[facility] here to transport patient. Assessments remains unchanged as at now.

## 2014-03-20 NOTE — Progress Notes (Signed)
Patient ready to be transferred, awaiting on [facility] for transportation.

## 2014-03-21 LAB — PROTHROMBIN GENE MUTATION

## 2014-03-21 LAB — CARDIOLIPIN ANTIBODIES, IGG, IGM, IGA
ANTICARDIOLIPIN IGA: 7 U/mL — AB (ref <22)
ANTICARDIOLIPIN IGG: 6 GPL U/mL — AB (ref <23)
Anticardiolipin IgM: 6 MPL U/mL — ABNORMAL LOW (ref <11)

## 2014-03-24 LAB — ALDOSTERONE + RENIN ACTIVITY W/ RATIO
ALDO / PRA RATIO: 10 ratio (ref 0.9–28.9)
Aldosterone: 5 ng/dL
PRA LC/MS/MS: 0.5 ng/mL/h (ref 0.25–5.82)

## 2014-05-03 ENCOUNTER — Emergency Department (HOSPITAL_BASED_OUTPATIENT_CLINIC_OR_DEPARTMENT_OTHER)
Admission: EM | Admit: 2014-05-03 | Discharge: 2014-05-03 | Disposition: A | Discharge status: Home / Self Care | Payer: Medicaid Other | Attending: Emergency Medicine | Admitting: Emergency Medicine

## 2014-05-03 ENCOUNTER — Encounter (HOSPITAL_BASED_OUTPATIENT_CLINIC_OR_DEPARTMENT_OTHER): Payer: Self-pay | Admitting: *Deleted

## 2014-05-03 DIAGNOSIS — Z8673 Personal history of transient ischemic attack (TIA), and cerebral infarction without residual deficits: Secondary | ICD-10-CM | POA: Insufficient documentation

## 2014-05-03 DIAGNOSIS — E119 Type 2 diabetes mellitus without complications: Secondary | ICD-10-CM | POA: Diagnosis not present

## 2014-05-03 DIAGNOSIS — R1110 Vomiting, unspecified: Secondary | ICD-10-CM | POA: Diagnosis not present

## 2014-05-03 DIAGNOSIS — I10 Essential (primary) hypertension: Secondary | ICD-10-CM | POA: Diagnosis present

## 2014-05-03 DIAGNOSIS — Z3202 Encounter for pregnancy test, result negative: Secondary | ICD-10-CM | POA: Diagnosis not present

## 2014-05-03 DIAGNOSIS — I159 Secondary hypertension, unspecified: Secondary | ICD-10-CM | POA: Insufficient documentation

## 2014-05-03 DIAGNOSIS — Z7982 Long term (current) use of aspirin: Secondary | ICD-10-CM | POA: Diagnosis not present

## 2014-05-03 DIAGNOSIS — Z79899 Other long term (current) drug therapy: Secondary | ICD-10-CM | POA: Diagnosis not present

## 2014-05-03 HISTORY — DX: Cerebral infarction, unspecified: I63.9

## 2014-05-03 LAB — CBC WITH DIFFERENTIAL/PLATELET
Basophils Absolute: 0 10*3/uL (ref 0.0–0.1)
Basophils Relative: 1 % (ref 0–1)
Eosinophils Absolute: 0 10*3/uL (ref 0.0–0.7)
Eosinophils Relative: 0 % (ref 0–5)
HCT: 34.8 % — ABNORMAL LOW (ref 36.0–46.0)
Hemoglobin: 12.3 g/dL (ref 12.0–15.0)
Lymphocytes Relative: 20 % (ref 12–46)
Lymphs Abs: 1.4 10*3/uL (ref 0.7–4.0)
MCH: 28.9 pg (ref 26.0–34.0)
MCHC: 35.3 g/dL (ref 30.0–36.0)
MCV: 81.7 fL (ref 78.0–100.0)
Monocytes Absolute: 0.5 10*3/uL (ref 0.1–1.0)
Monocytes Relative: 7 % (ref 3–12)
Neutro Abs: 5 10*3/uL (ref 1.7–7.7)
Neutrophils Relative %: 72 % (ref 43–77)
Platelets: 356 10*3/uL (ref 150–400)
RBC: 4.26 MIL/uL (ref 3.87–5.11)
RDW: 11.7 % (ref 11.5–15.5)
WBC: 7 10*3/uL (ref 4.0–10.5)

## 2014-05-03 LAB — URINALYSIS, ROUTINE W REFLEX MICROSCOPIC
Bilirubin Urine: NEGATIVE
Glucose, UA: NEGATIVE mg/dL
Ketones, ur: NEGATIVE mg/dL
Nitrite: NEGATIVE
Protein, ur: NEGATIVE mg/dL
Specific Gravity, Urine: 1.018 (ref 1.005–1.030)
Urobilinogen, UA: 1 mg/dL (ref 0.0–1.0)
pH: 6 (ref 5.0–8.0)

## 2014-05-03 LAB — BASIC METABOLIC PANEL
Anion gap: 16 — ABNORMAL HIGH (ref 5–15)
BUN: 21 mg/dL (ref 6–23)
CO2: 25 mEq/L (ref 19–32)
Calcium: 9.7 mg/dL (ref 8.4–10.5)
Chloride: 99 mEq/L (ref 96–112)
Creatinine, Ser: 1.1 mg/dL (ref 0.50–1.10)
GFR calc Af Amer: 70 mL/min — ABNORMAL LOW (ref >90)
GFR calc non Af Amer: 61 mL/min — ABNORMAL LOW (ref >90)
Glucose, Bld: 152 mg/dL — ABNORMAL HIGH (ref 70–99)
Potassium: 3.5 mEq/L — ABNORMAL LOW (ref 3.7–5.3)
Sodium: 140 mEq/L (ref 137–147)

## 2014-05-03 LAB — CBG MONITORING, ED: GLUCOSE-CAPILLARY: 140 mg/dL — AB (ref 70–99)

## 2014-05-03 LAB — URINE MICROSCOPIC-ADD ON

## 2014-05-03 LAB — PREGNANCY, URINE: Preg Test, Ur: NEGATIVE

## 2014-05-03 NOTE — ED Notes (Addendum)
Patient states that she came to the ER for hypertension, however patient also vomiting in triage. Family states she has vomited appx 2 times this week. Takes her medications on an empty stomach and then vomits.

## 2014-05-03 NOTE — ED Provider Notes (Signed)
CSN: [id number]     Arrival date & time 05/03/14  [id number] History   First MD Initiated Contact with Patient 05/03/14 [phone]     Chief Complaint  Patient presents with  . Hypertension     (Consider location/radiation/quality/duration/timing/severity/associated sxs/prior Treatment) HPI Comments: Patient is brought in by aunt for a blood pressure of 165/102 this morning. She states she is asymptomatic however had one episode of vomiting approximately one hour after taking her current medications. She's had no new numbness or weakness. No chest pain, no shortness of breath, no urinary symptoms.  Patient is a 43 y.o. female presenting with hypertension.  Hypertension Pertinent negatives include no chest pain, no abdominal pain, no headaches and no shortness of breath.    Past Medical History  Diagnosis Date  . Hypertension   . Diabetes mellitus   . Stroke    Past Surgical History  Procedure Laterality Date  . Cyst removed    . Tonsillectomy    . Tee without cardioversion N/A 03/18/2014    Procedure: TRANSESOPHAGEAL ECHOCARDIOGRAM (TEE);  Surgeon: Chrystie NoseKenneth C. Hilty, MD;  [facility];  Service: Cardiovascular;  Laterality: N/A;   No family history on file. History  Substance Use Topics  . Smoking status: Never Smoker   . Smokeless tobacco: Not on file  . Alcohol Use: No   OB History    No data available     Review of Systems  Constitutional: Negative for fever, chills, diaphoresis, activity change, appetite change and fatigue.  HENT: Negative for congestion, facial swelling, rhinorrhea and sore throat.   Eyes: Negative for photophobia and discharge.  Respiratory: Negative for cough, chest tightness and shortness of breath.   Cardiovascular: Negative for chest pain, palpitations and leg swelling.  Gastrointestinal: Positive for vomiting. Negative for nausea, abdominal pain and diarrhea.  Endocrine: Negative for polydipsia and polyuria.  Genitourinary: Negative for  dysuria, frequency, difficulty urinating and pelvic pain.  Musculoskeletal: Negative for back pain, arthralgias, neck pain and neck stiffness.  Skin: Negative for color change and wound.  Allergic/Immunologic: Negative for immunocompromised state.  Neurological: Negative for facial asymmetry, weakness, numbness and headaches.  Hematological: Does not bruise/bleed easily.  Psychiatric/Behavioral: Negative for confusion and agitation.      Allergies  Morphine and related  Home Medications   Prior to Admission medications   Medication Sig Start Date End Date Taking? Authorizing Provider  hydrALAZINE (APRESOLINE) 25 MG tablet Take 25 mg by mouth 3 (three) times daily.   Yes Historical Provider, MD  amLODipine (NORVASC) 5 MG tablet Take 1 tablet (5 mg total) by mouth daily. 03/20/14   Layne BentonSharon L Biby, NP  aspirin 325 MG tablet Take 1 tablet (325 mg total) by mouth daily. 03/20/14   Layne BentonSharon L Biby, NP  atenolol (TENORMIN) 50 MG tablet Take 50 mg by mouth daily.    Historical Provider, MD  atorvastatin (LIPITOR) 20 MG tablet Take 1 tablet (20 mg total) by mouth daily at 6 PM. 03/20/14   Layne BentonSharon L Biby, NP  cloNIDine (CATAPRES) 0.2 MG tablet Take 0.2 mg by mouth 2 (two) times daily.    Historical Provider, MD  hydrochlorothiazide (HYDRODIURIL) 25 MG tablet Take 25 mg by mouth daily.    Historical Provider, MD  HYDROcodone-acetaminophen (NORCO) 10-325 MG per tablet Take 0.5-1 tablets by mouth every 4 (four) hours as needed (for pain; may cause constipation). 11/04/13   John L Molpus, MD  lisinopril (PRINIVIL,ZESTRIL) 20 MG tablet Take 20 mg by mouth daily.  Historical Provider, MD  metFORMIN (GLUCOPHAGE) 500 MG tablet Take 500 mg by mouth 2 (two) times daily with a meal.    Historical Provider, MD  promethazine (PHENERGAN) 25 MG tablet Take 1 tablet (25 mg total) by mouth every 6 (six) hours as needed for nausea or vomiting. 11/04/13   John L Molpus, MD   BP 142/70 mmHg  Pulse 69  Temp(Src) 97.8 F  (36.6 C) (Oral)  Resp 20  Ht 5\' 5"  (1.651 m)  Wt 134 lb (60.782 kg)  BMI 22.30 kg/m2  SpO2 100% Physical Exam  Constitutional: She is oriented to person, place, and time. She appears well-developed and well-nourished. No distress.  HENT:  Head: Normocephalic and atraumatic.  Mouth/Throat: No oropharyngeal exudate.  Eyes: Pupils are equal, round, and reactive to light.  Neck: Normal range of motion. Neck supple.  Cardiovascular: Normal rate, regular rhythm and normal heart sounds.  Exam reveals no gallop and no friction rub.   No murmur heard. Pulmonary/Chest: Effort normal and breath sounds normal. No respiratory distress. She has no wheezes. She has no rales.  Abdominal: Soft. Bowel sounds are normal. She exhibits no distension and no mass. There is no tenderness. There is no rebound and no guarding.  Musculoskeletal: Normal range of motion. She exhibits no edema or tenderness.  Neurological: She is alert and oriented to person, place, and time.  Unchanged 4 out of 5 strength in left upper extremity and left lower extremity  Skin: Skin is warm and dry.  Psychiatric: She has a normal mood and affect.    ED Course  Procedures (including critical care time) Labs Review Labs Reviewed  CBC WITH DIFFERENTIAL - Abnormal; Notable for the following:    HCT 34.8 (*)    All other components within normal limits  BASIC METABOLIC PANEL - Abnormal; Notable for the following:    Potassium 3.5 (*)    Glucose, Bld 152 (*)    GFR calc non Af Amer 61 (*)    GFR calc Af Amer 70 (*)    Anion gap 16 (*)    All other components within normal limits  URINALYSIS, ROUTINE W REFLEX MICROSCOPIC - Abnormal; Notable for the following:    APPearance CLOUDY (*)    Hgb urine dipstick SMALL (*)    Leukocytes, UA MODERATE (*)    All other components within normal limits  URINE MICROSCOPIC-ADD ON - Abnormal; Notable for the following:    Squamous Epithelial / LPF MANY (*)    Bacteria, UA MANY (*)     Casts HYALINE CASTS (*)    All other components within normal limits  CBG MONITORING, ED - Abnormal; Notable for the following:    Glucose-Capillary 140 (*)    All other components within normal limits  PREGNANCY, URINE    Imaging Review No results found.   EKG Interpretation   Date/Time:  Saturday May 03 2014 10:05:32 EST Ventricular Rate:  68 PR Interval:  160 QRS Duration: 94 QT Interval:  398 QTC Calculation: 423 R Axis:   63 Text Interpretation:  Normal sinus rhythm Left ventricular hypertrophy  with repolarization abnormality Abnormal ECG No significant change since  last tracing Confirmed by DOCHERTY  MD, MEGAN ([id number]) on 05/03/2014 10:14:38  AM      MDM   Final diagnoses:  Secondary hypertension, unspecified    Pt is a 43 y.o. female with Pmhx as above who presents with hypertension.patient states that she has had a recent CVA and it spent one  month in the hospital as [facility]. Patient's on check her blood pressure this morning and found it to be 165/102 and so was brought to the emergency room. She states she took her blood pressure medicine about 8:30 this morning but then had one episode of vomiting here approximately an hour later. She denies any current symptoms including no new numbness or weakness, headache, chest pain, abdominal pain, fevers or chills. On physical exam she is mildly hypertensive at 185/90. No new neurologic findings on physical exam. She has mild left upper extremity and left lower extremity weakness. I will screen for signs of end organ damage that I do not feel she is having another stroke.    Workup shows no signs of acute end organ damage, including EKG without ischemic findings, stable creatinine. No signs or symptoms of pulmonary edema. Blood pressure has come down to 142/70 without intervention. I will discharge home with plan for continued daily blood pressure monitoring. She can return to the ED  for worsening symptoms including chest pain, headache, stroke like symptoms. She can otherwise follow-up with her PCP    Toy Cookey, MD 05/03/14 [phone]

## 2014-05-03 NOTE — Discharge Instructions (Signed)
Hypertension Hypertension is another name for high blood pressure. High blood pressure forces your heart to work harder to pump blood. A blood pressure reading has two numbers, which includes a higher number over a lower number (example: 110/72). HOME CARE   Have your blood pressure rechecked by your doctor.  Only take medicine as told by your doctor. Follow the directions carefully. The medicine does not work as well if you skip doses. Skipping doses also puts you at risk for problems.  Do not smoke.  Monitor your blood pressure at home as told by your doctor. GET HELP IF:  You think you are having a reaction to the medicine you are taking.  You have repeat headaches or feel dizzy.  You have puffiness (swelling) in your ankles.  You have trouble with your vision. GET HELP RIGHT AWAY IF:   You get a very bad headache and are confused.  You feel weak, numb, or faint.  You get chest or belly (abdominal) pain.  You throw up (vomit).  You cannot breathe very well. MAKE SURE YOU:   Understand these instructions.  Will watch your condition.  Will get help right away if you are not doing well or get worse. Document Released: 11/30/2007 Document Revised: 06/18/2013 Document Reviewed: 04/05/2013 Exit[employer] Patient Information 2015 [employer]. This information is not intended to replace advice given to you by your health care provider. Make sure you discuss any questions you have with your health care provider.  

## 2014-05-16 ENCOUNTER — Emergency Department (HOSPITAL_BASED_OUTPATIENT_CLINIC_OR_DEPARTMENT_OTHER): Payer: Medicaid Other

## 2014-05-16 ENCOUNTER — Inpatient Hospital Stay (HOSPITAL_BASED_OUTPATIENT_CLINIC_OR_DEPARTMENT_OTHER)
Admission: EM | Admit: 2014-05-16 | Discharge: 2014-05-18 | DRG: 689 | Disposition: A | Discharge status: Home / Self Care | Payer: Medicaid Other | Attending: Internal Medicine | Admitting: Internal Medicine

## 2014-05-16 DIAGNOSIS — E876 Hypokalemia: Secondary | ICD-10-CM | POA: Diagnosis present

## 2014-05-16 DIAGNOSIS — I639 Cerebral infarction, unspecified: Secondary | ICD-10-CM | POA: Diagnosis present

## 2014-05-16 DIAGNOSIS — E1165 Type 2 diabetes mellitus with hyperglycemia: Secondary | ICD-10-CM | POA: Diagnosis present

## 2014-05-16 DIAGNOSIS — K859 Acute pancreatitis without necrosis or infection, unspecified: Secondary | ICD-10-CM

## 2014-05-16 DIAGNOSIS — E785 Hyperlipidemia, unspecified: Secondary | ICD-10-CM | POA: Diagnosis present

## 2014-05-16 DIAGNOSIS — R05 Cough: Secondary | ICD-10-CM

## 2014-05-16 DIAGNOSIS — I10 Essential (primary) hypertension: Secondary | ICD-10-CM | POA: Diagnosis present

## 2014-05-16 DIAGNOSIS — R112 Nausea with vomiting, unspecified: Secondary | ICD-10-CM | POA: Diagnosis present

## 2014-05-16 DIAGNOSIS — Z79899 Other long term (current) drug therapy: Secondary | ICD-10-CM

## 2014-05-16 DIAGNOSIS — R778 Other specified abnormalities of plasma proteins: Secondary | ICD-10-CM

## 2014-05-16 DIAGNOSIS — IMO0002 Reserved for concepts with insufficient information to code with codable children: Secondary | ICD-10-CM | POA: Diagnosis present

## 2014-05-16 DIAGNOSIS — R7989 Other specified abnormal findings of blood chemistry: Secondary | ICD-10-CM | POA: Diagnosis present

## 2014-05-16 DIAGNOSIS — N179 Acute kidney failure, unspecified: Secondary | ICD-10-CM | POA: Diagnosis present

## 2014-05-16 DIAGNOSIS — Z8673 Personal history of transient ischemic attack (TIA), and cerebral infarction without residual deficits: Secondary | ICD-10-CM

## 2014-05-16 DIAGNOSIS — N390 Urinary tract infection, site not specified: Secondary | ICD-10-CM | POA: Diagnosis present

## 2014-05-16 DIAGNOSIS — Z7982 Long term (current) use of aspirin: Secondary | ICD-10-CM

## 2014-05-16 DIAGNOSIS — R059 Cough, unspecified: Secondary | ICD-10-CM

## 2014-05-16 DIAGNOSIS — I5042 Chronic combined systolic (congestive) and diastolic (congestive) heart failure: Secondary | ICD-10-CM | POA: Diagnosis present

## 2014-05-16 DIAGNOSIS — I509 Heart failure, unspecified: Secondary | ICD-10-CM

## 2014-05-16 LAB — LIPASE, BLOOD: Lipase: 154 U/L — ABNORMAL HIGH (ref 11–59)

## 2014-05-16 LAB — COMPREHENSIVE METABOLIC PANEL
ALK PHOS: 75 U/L (ref 39–117)
ALT: 14 U/L (ref 0–35)
ANION GAP: 21 — AB (ref 5–15)
AST: 19 U/L (ref 0–37)
Albumin: 4.6 g/dL (ref 3.5–5.2)
BILIRUBIN TOTAL: 0.3 mg/dL (ref 0.3–1.2)
BUN: 31 mg/dL — AB (ref 6–23)
CHLORIDE: 95 meq/L — AB (ref 96–112)
CO2: 22 mEq/L (ref 19–32)
Calcium: 10.4 mg/dL (ref 8.4–10.5)
Creatinine, Ser: 1.3 mg/dL — ABNORMAL HIGH (ref 0.50–1.10)
GFR calc non Af Amer: 49 mL/min — ABNORMAL LOW (ref >90)
GFR, EST AFRICAN AMERICAN: 57 mL/min — AB (ref >90)
Glucose, Bld: 164 mg/dL — ABNORMAL HIGH (ref 70–99)
POTASSIUM: 3.3 meq/L — AB (ref 3.7–5.3)
Sodium: 138 mEq/L (ref 137–147)
Total Protein: 8.9 g/dL — ABNORMAL HIGH (ref 6.0–8.3)

## 2014-05-16 LAB — CBC WITH DIFFERENTIAL/PLATELET
Basophils Absolute: 0 10*3/uL (ref 0.0–0.1)
Basophils Relative: 0 % (ref 0–1)
EOS ABS: 0 10*3/uL (ref 0.0–0.7)
Eosinophils Relative: 0 % (ref 0–5)
HCT: 33.5 % — ABNORMAL LOW (ref 36.0–46.0)
HEMOGLOBIN: 11.9 g/dL — AB (ref 12.0–15.0)
Lymphocytes Relative: 9 % — ABNORMAL LOW (ref 12–46)
Lymphs Abs: 1.1 10*3/uL (ref 0.7–4.0)
MCH: 28.7 pg (ref 26.0–34.0)
MCHC: 35.5 g/dL (ref 30.0–36.0)
MCV: 80.7 fL (ref 78.0–100.0)
MONOS PCT: 4 % (ref 3–12)
Monocytes Absolute: 0.6 10*3/uL (ref 0.1–1.0)
NEUTROS ABS: 11.5 10*3/uL — AB (ref 1.7–7.7)
NEUTROS PCT: 87 % — AB (ref 43–77)
Platelets: 299 10*3/uL (ref 150–400)
RBC: 4.15 MIL/uL (ref 3.87–5.11)
RDW: 11.6 % (ref 11.5–15.5)
WBC: 13.2 10*3/uL — ABNORMAL HIGH (ref 4.0–10.5)

## 2014-05-16 LAB — URINALYSIS, ROUTINE W REFLEX MICROSCOPIC
Bilirubin Urine: NEGATIVE
GLUCOSE, UA: NEGATIVE mg/dL
HGB URINE DIPSTICK: NEGATIVE
KETONES UR: NEGATIVE mg/dL
Nitrite: NEGATIVE
Protein, ur: NEGATIVE mg/dL
Specific Gravity, Urine: 1.016 (ref 1.005–1.030)
Urobilinogen, UA: 0.2 mg/dL (ref 0.0–1.0)
pH: 6 (ref 5.0–8.0)

## 2014-05-16 LAB — URINE MICROSCOPIC-ADD ON

## 2014-05-16 LAB — TROPONIN I: Troponin I: 0.34 ng/mL (ref <0.30)

## 2014-05-16 MED ORDER — CEFTRIAXONE SODIUM 1 G IJ SOLR
1.0000 g | Freq: Once | INTRAMUSCULAR | Status: AC
  Administered 2014-05-16: 1 g via INTRAVENOUS

## 2014-05-16 MED ORDER — CLONIDINE HCL 0.1 MG PO TABS
0.2000 mg | ORAL_TABLET | Freq: Once | ORAL | Status: AC
  Administered 2014-05-16: 0.2 mg via ORAL
  Filled 2014-05-16: qty 2

## 2014-05-16 MED ORDER — CEFTRIAXONE SODIUM 1 G IJ SOLR
INTRAMUSCULAR | Status: AC
  Filled 2014-05-16: qty 10

## 2014-05-16 MED ORDER — SODIUM CHLORIDE 0.9 % IV BOLUS (SEPSIS)
2000.0000 mL | Freq: Once | INTRAVENOUS | Status: AC
  Administered 2014-05-16: 2000 mL via INTRAVENOUS

## 2014-05-16 MED ORDER — ASPIRIN 325 MG PO TABS
325.0000 mg | ORAL_TABLET | Freq: Once | ORAL | Status: AC
  Administered 2014-05-16: 325 mg via ORAL
  Filled 2014-05-16: qty 1

## 2014-05-16 NOTE — ED Provider Notes (Signed)
CSN: [id number]     Arrival date & time 05/16/14  1905 History   First MD Initiated Contact with Patient 05/16/14 2122     Chief Complaint  Patient presents with  . Hypertension  . Emesis     (Consider location/radiation/quality/duration/timing/severity/associated sxs/prior Treatment) HPI 43 year old female with history of hypertension diabetes and prior stroke with residual right arm weakness presents with 1 day of nasal congestion cough with several episodes of nonbloody vomiting with no abdominal pain no headache no change in mental status no new focal neurologic symptoms no chest pain no shortness of breath no diarrhea no bloody vomiting no other concerns. Past Medical History  Diagnosis Date  . Hypertension   . Diabetes mellitus   . Stroke    Past Surgical History  Procedure Laterality Date  . Cyst removed    . Tonsillectomy    . Tee without cardioversion N/A 03/18/2014    Procedure: TRANSESOPHAGEAL ECHOCARDIOGRAM (TEE);  Surgeon: Chrystie Nose, MD;  Location: [facility] ENDOSCOPY;  Service: Cardiovascular;  Laterality: N/A;   Family History  Problem Relation Age of Onset  . Diabetes Mother   . Diabetes Father   . Hypertension Brother    History  Substance Use Topics  . Smoking status: Never Smoker   . Smokeless tobacco: Not on file  . Alcohol Use: No   OB History    No data available     Review of Systems 10 Systems reviewed and are negative for acute change except as noted in the HPI.   Allergies  Morphine and related  Home Medications   Prior to Admission medications   Medication Sig Start Date End Date Taking? Authorizing Provider  amLODipine (NORVASC) 10 MG tablet Take 10 mg by mouth daily.  04/24/14  Yes Historical Provider, MD  aspirin 325 MG tablet Take 1 tablet (325 mg total) by mouth daily. 03/20/14  Yes Layne Benton, NP  atenolol (TENORMIN) 50 MG tablet Take 50 mg by mouth daily.   Yes Historical Provider, MD  atorvastatin (LIPITOR) 20 MG tablet Take  1 tablet (20 mg total) by mouth daily at 6 PM. 03/20/14  Yes Layne Benton, NP  cloNIDine (CATAPRES) 0.2 MG tablet Take 0.2 mg by mouth 2 (two) times daily.   Yes Historical Provider, MD  Dextromethorphan-Quinidine 20-10 MG CAPS Take 1 capsule by mouth daily.   Yes Historical Provider, MD  hydrALAZINE (APRESOLINE) 25 MG tablet Take 75 mg by mouth 4 (four) times daily.    Yes Historical Provider, MD  metFORMIN (GLUCOPHAGE) 500 MG tablet Take 1,000 mg by mouth 2 (two) times daily with a meal.    Yes Historical Provider, MD  metoprolol succinate (TOPROL-XL) 100 MG 24 hr tablet Take 100 mg by mouth daily.  10/06/10  Yes Historical Provider, MD  cefUROXime (CEFTIN) 250 MG tablet Take 2 tablets (500 mg total) by mouth 2 (two) times daily with a meal. 05/18/14   Jeralyn Bennett, MD  HYDROcodone-acetaminophen (NORCO) 10-325 MG per tablet Take 0.5-1 tablets by mouth every 4 (four) hours as needed (for pain; may cause constipation). Patient not taking: Reported on 05/17/2014 11/04/13   Carlisle Beers Molpus, MD  promethazine (PHENERGAN) 25 MG tablet Take 1 tablet (25 mg total) by mouth every 6 (six) hours as needed for nausea or vomiting. Patient not taking: Reported on 05/17/2014 11/04/13   Carlisle Beers Molpus, MD   BP 132/59 mmHg  Pulse 59  Temp(Src) 98.1 F (36.7 C) (Oral)  Resp 14  Ht 5'  4" (1.626 m)  Wt 139 lb (63.05 kg)  BMI 23.85 kg/m2  SpO2 100% Physical Exam  Constitutional:  Awake, alert, nontoxic appearance.  HENT:  Head: Atraumatic.  Eyes: Right eye exhibits no discharge. Left eye exhibits no discharge.  Neck: Neck supple.  Cardiovascular: Normal rate and regular rhythm.   No murmur heard. Pulmonary/Chest: Effort normal and breath sounds normal. No respiratory distress. She has no wheezes. She has no rales. She exhibits no tenderness.  Pulse oximetry normal room air 100%  Abdominal: Soft. Bowel sounds are normal. She exhibits no distension and no mass. There is no tenderness. There is no rebound and  no guarding.  Musculoskeletal: She exhibits no edema or tenderness.  Baseline ROM, no obvious new focal weakness. Baseline right arm weakness.  Neurological: She is alert.  Mental status and motor strength appears baseline for patient and situation. Baseline right arm weakness.  Skin: No rash noted.  Psychiatric: She has a normal mood and affect.  Nursing note and vitals reviewed.   ED Course  Procedures (including critical care time) D/w Cards recs Hospitalist Obs. 2200 Patient missed her 8 PM doses of clonidine and hydralazine due to vomiting prior to arrival patient does not have nausea or vomiting in the ED and repeat blood pressure in the ED is in the 160s systolic over the 80 diastolic. 2220  D/w [facility] for Obs transfer. Elevated troponin uncertain significance.  2330 The patient agrees to, and appears reasonably stabilized for transfer considering the current resources, flow, and capabilities available in the ED at this time, and I doubt any other [facility] requiring further screening and/or treatment in the ED prior to transfer. Rocephin and urine culture ordered for UTI.  Labs Review Labs Reviewed  CBC WITH DIFFERENTIAL - Abnormal; Notable for the following:    WBC 13.2 (*)    Hemoglobin 11.9 (*)    HCT 33.5 (*)    Neutrophils Relative % 87 (*)    Neutro Abs 11.5 (*)    Lymphocytes Relative 9 (*)    All other components within normal limits  COMPREHENSIVE METABOLIC PANEL - Abnormal; Notable for the following:    Potassium 3.3 (*)    Chloride 95 (*)    Glucose, Bld 164 (*)    BUN 31 (*)    Creatinine, Ser 1.30 (*)    Total Protein 8.9 (*)    GFR calc non Af Amer 49 (*)    GFR calc Af Amer 57 (*)    Anion gap 21 (*)    All other components within normal limits  LIPASE, BLOOD - Abnormal; Notable for the following:    Lipase 154 (*)    All other components within normal limits  TROPONIN I - Abnormal; Notable for the following:    Troponin I 0.34 (*)    All other components  within normal limits  URINALYSIS, ROUTINE W REFLEX MICROSCOPIC - Abnormal; Notable for the following:    APPearance CLOUDY (*)    Leukocytes, UA LARGE (*)    All other components within normal limits  URINE MICROSCOPIC-ADD ON - Abnormal; Notable for the following:    Squamous Epithelial / LPF FEW (*)    Bacteria, UA MANY (*)    All other components within normal limits  GLUCOSE, CAPILLARY - Abnormal; Notable for the following:    Glucose-Capillary 129 (*)    All other components within normal limits  LIPID PANEL - Abnormal; Notable for the following:    HDL 23 (*)  All other components within normal limits  PRO B NATRIURETIC PEPTIDE - Abnormal; Notable for the following:    Pro B Natriuretic peptide (BNP) 2215.0 (*)    All other components within normal limits  COMPREHENSIVE METABOLIC PANEL - Abnormal; Notable for the following:    Potassium 3.0 (*)    Glucose, Bld 118 (*)    BUN 25 (*)    GFR calc non Af Amer 63 (*)    GFR calc Af Amer 73 (*)    Anion gap 18 (*)    All other components within normal limits  CBC - Abnormal; Notable for the following:    RBC 3.72 (*)    Hemoglobin 10.4 (*)    HCT 29.5 (*)    All other components within normal limits  GLUCOSE, CAPILLARY - Abnormal; Notable for the following:    Glucose-Capillary 120 (*)    All other components within normal limits  GLUCOSE, CAPILLARY - Abnormal; Notable for the following:    Glucose-Capillary 154 (*)    All other components within normal limits  BASIC METABOLIC PANEL - Abnormal; Notable for the following:    Potassium 3.5 (*)    Glucose, Bld 138 (*)    Creatinine, Ser 1.14 (*)    GFR calc non Af Amer 58 (*)    GFR calc Af Amer 67 (*)    Anion gap 16 (*)    All other components within normal limits  CBC - Abnormal; Notable for the following:    Hemoglobin 10.9 (*)    HCT 31.8 (*)    All other components within normal limits  GLUCOSE, CAPILLARY - Abnormal; Notable for the following:     Glucose-Capillary 164 (*)    All other components within normal limits  GLUCOSE, CAPILLARY - Abnormal; Notable for the following:    Glucose-Capillary 139 (*)    All other components within normal limits  URINE CULTURE  MRSA PCR SCREENING  CULTURE, BLOOD (ROUTINE X 2)  CULTURE, BLOOD (ROUTINE X 2)  TROPONIN I  URINE RAPID DRUG SCREEN (HOSP PERFORMED)  ETHANOL  CREATININE, URINE, RANDOM  UREA NITROGEN, URINE  PROTIME-INR  HCG, QUANTITATIVE, PREGNANCY  GLUCOSE, CAPILLARY    Imaging Review No results found.   EKG Interpretation   Date/Time:  Friday May 16 2014 19:56:44 EST Ventricular Rate:  97 PR Interval:  170 QRS Duration: 96 QT Interval:  390 QTC Calculation: 495 R Axis:   74 Text Interpretation:  Normal sinus rhythm Lateral infarct , age  undetermined Left ventricular hypertrophy with repolarization abnormality  No significant change since last tracing Confirmed by [facility]  MD, Jonny Ruiz  [phone]) on 05/16/2014 9:25:23 PM      MDM   Final diagnoses:  Cough  Elevated troponin  Urinary tract infection without hematuria, site unspecified  Essential hypertension        Hurman Horn, MD 05/22/14 1318

## 2014-05-16 NOTE — ED Notes (Signed)
Pt. Recently treated for UTI  Pt here today due to increased B/P.  Per Family she vomited several times per family and is acting normal per family.

## 2014-05-16 NOTE — ED Notes (Signed)
Patient has noted weakness to her right side, r/t a previous stroke.

## 2014-05-17 ENCOUNTER — Encounter (HOSPITAL_COMMUNITY): Payer: Self-pay | Admitting: *Deleted

## 2014-05-17 ENCOUNTER — Inpatient Hospital Stay (HOSPITAL_COMMUNITY): Payer: Medicaid Other

## 2014-05-17 DIAGNOSIS — K859 Acute pancreatitis without necrosis or infection, unspecified: Secondary | ICD-10-CM | POA: Insufficient documentation

## 2014-05-17 DIAGNOSIS — R778 Other specified abnormalities of plasma proteins: Secondary | ICD-10-CM | POA: Diagnosis present

## 2014-05-17 DIAGNOSIS — E785 Hyperlipidemia, unspecified: Secondary | ICD-10-CM | POA: Diagnosis present

## 2014-05-17 DIAGNOSIS — N390 Urinary tract infection, site not specified: Secondary | ICD-10-CM | POA: Diagnosis present

## 2014-05-17 DIAGNOSIS — Z8673 Personal history of transient ischemic attack (TIA), and cerebral infarction without residual deficits: Secondary | ICD-10-CM | POA: Diagnosis not present

## 2014-05-17 DIAGNOSIS — E876 Hypokalemia: Secondary | ICD-10-CM | POA: Diagnosis present

## 2014-05-17 DIAGNOSIS — N179 Acute kidney failure, unspecified: Secondary | ICD-10-CM | POA: Diagnosis present

## 2014-05-17 DIAGNOSIS — K850 Idiopathic acute pancreatitis: Secondary | ICD-10-CM

## 2014-05-17 DIAGNOSIS — R05 Cough: Secondary | ICD-10-CM | POA: Diagnosis not present

## 2014-05-17 DIAGNOSIS — I10 Essential (primary) hypertension: Secondary | ICD-10-CM

## 2014-05-17 DIAGNOSIS — E1165 Type 2 diabetes mellitus with hyperglycemia: Secondary | ICD-10-CM

## 2014-05-17 DIAGNOSIS — I639 Cerebral infarction, unspecified: Secondary | ICD-10-CM | POA: Diagnosis present

## 2014-05-17 DIAGNOSIS — R7989 Other specified abnormal findings of blood chemistry: Secondary | ICD-10-CM | POA: Diagnosis present

## 2014-05-17 DIAGNOSIS — Z7982 Long term (current) use of aspirin: Secondary | ICD-10-CM | POA: Diagnosis not present

## 2014-05-17 DIAGNOSIS — I5042 Chronic combined systolic (congestive) and diastolic (congestive) heart failure: Secondary | ICD-10-CM | POA: Diagnosis present

## 2014-05-17 DIAGNOSIS — I509 Heart failure, unspecified: Secondary | ICD-10-CM

## 2014-05-17 DIAGNOSIS — Z79899 Other long term (current) drug therapy: Secondary | ICD-10-CM | POA: Diagnosis not present

## 2014-05-17 LAB — COMPREHENSIVE METABOLIC PANEL
ALT: 11 U/L (ref 0–35)
AST: 13 U/L (ref 0–37)
Albumin: 3.7 g/dL (ref 3.5–5.2)
Alkaline Phosphatase: 63 U/L (ref 39–117)
Anion gap: 18 — ABNORMAL HIGH (ref 5–15)
BUN: 25 mg/dL — AB (ref 6–23)
CALCIUM: 9 mg/dL (ref 8.4–10.5)
CHLORIDE: 100 meq/L (ref 96–112)
CO2: 23 mEq/L (ref 19–32)
CREATININE: 1.07 mg/dL (ref 0.50–1.10)
GFR calc non Af Amer: 63 mL/min — ABNORMAL LOW (ref >90)
GFR, EST AFRICAN AMERICAN: 73 mL/min — AB (ref >90)
GLUCOSE: 118 mg/dL — AB (ref 70–99)
Potassium: 3 mEq/L — ABNORMAL LOW (ref 3.7–5.3)
Sodium: 141 mEq/L (ref 137–147)
Total Bilirubin: 0.3 mg/dL (ref 0.3–1.2)
Total Protein: 7.1 g/dL (ref 6.0–8.3)

## 2014-05-17 LAB — CBC
HCT: 29.5 % — ABNORMAL LOW (ref 36.0–46.0)
Hemoglobin: 10.4 g/dL — ABNORMAL LOW (ref 12.0–15.0)
MCH: 28 pg (ref 26.0–34.0)
MCHC: 35.3 g/dL (ref 30.0–36.0)
MCV: 79.3 fL (ref 78.0–100.0)
PLATELETS: 297 10*3/uL (ref 150–400)
RBC: 3.72 MIL/uL — AB (ref 3.87–5.11)
RDW: 11.9 % (ref 11.5–15.5)
WBC: 9.1 10*3/uL (ref 4.0–10.5)

## 2014-05-17 LAB — RAPID URINE DRUG SCREEN, HOSP PERFORMED
Amphetamines: NOT DETECTED
BENZODIAZEPINES: NOT DETECTED
Barbiturates: NOT DETECTED
COCAINE: NOT DETECTED
Opiates: NOT DETECTED
Tetrahydrocannabinol: NOT DETECTED

## 2014-05-17 LAB — LIPID PANEL
CHOL/HDL RATIO: 4.2 ratio
Cholesterol: 97 mg/dL (ref 0–200)
HDL: 23 mg/dL — AB (ref >39)
LDL CALC: 54 mg/dL (ref 0–99)
Triglycerides: 100 mg/dL (ref <150)
VLDL: 20 mg/dL (ref 0–40)

## 2014-05-17 LAB — MRSA PCR SCREENING: MRSA by PCR: NEGATIVE

## 2014-05-17 LAB — GLUCOSE, CAPILLARY
GLUCOSE-CAPILLARY: 154 mg/dL — AB (ref 70–99)
GLUCOSE-CAPILLARY: 164 mg/dL — AB (ref 70–99)
Glucose-Capillary: 120 mg/dL — ABNORMAL HIGH (ref 70–99)
Glucose-Capillary: 129 mg/dL — ABNORMAL HIGH (ref 70–99)
Glucose-Capillary: 94 mg/dL (ref 70–99)

## 2014-05-17 LAB — TROPONIN I: Troponin I: <0.3 ng/mL (ref <0.30)

## 2014-05-17 LAB — CREATININE, URINE, RANDOM: CREATININE, URINE: 95.15 mg/dL

## 2014-05-17 LAB — ETHANOL: Alcohol, Ethyl (B): <11 mg/dL (ref 0–11)

## 2014-05-17 LAB — PROTIME-INR
INR: 1.11 (ref 0.00–1.49)
Prothrombin Time: 14.4 seconds (ref 11.6–15.2)

## 2014-05-17 LAB — HCG, QUANTITATIVE, PREGNANCY

## 2014-05-17 LAB — PRO B NATRIURETIC PEPTIDE: PRO B NATRI PEPTIDE: 2215 pg/mL — AB (ref 0–125)

## 2014-05-17 MED ORDER — POTASSIUM CHLORIDE CRYS ER 20 MEQ PO TBCR: 40.0000 meq | EXTENDED_RELEASE_TABLET | Freq: Once | ORAL | Status: DC

## 2014-05-17 MED ORDER — ACETAMINOPHEN 325 MG PO TABS
650.0000 mg | ORAL_TABLET | Freq: Four times a day (QID) | ORAL | Status: DC | PRN
Start: 2014-05-17 — End: 2014-05-18

## 2014-05-17 MED ORDER — CLONIDINE HCL 0.2 MG PO TABS
0.2000 mg | ORAL_TABLET | Freq: Two times a day (BID) | ORAL | Status: DC
  Administered 2014-05-17 – 2014-05-18 (×3): 0.2 mg via ORAL
  Filled 2014-05-17 (×3): qty 1

## 2014-05-17 MED ORDER — AMLODIPINE BESYLATE 10 MG PO TABS
10.0000 mg | ORAL_TABLET | Freq: Every day | ORAL | Status: DC
  Administered 2014-05-17 – 2014-05-18 (×2): 10 mg via ORAL
  Filled 2014-05-17 (×2): qty 1

## 2014-05-17 MED ORDER — ATENOLOL 25 MG PO TABS
50.0000 mg | ORAL_TABLET | Freq: Every day | ORAL | Status: DC
  Administered 2014-05-17 – 2014-05-18 (×2): 50 mg via ORAL
  Filled 2014-05-17 (×2): qty 2

## 2014-05-17 MED ORDER — HYDRALAZINE HCL 25 MG PO TABS
25.0000 mg | ORAL_TABLET | Freq: Three times a day (TID) | ORAL | Status: DC
  Administered 2014-05-17 – 2014-05-18 (×4): 25 mg via ORAL
  Filled 2014-05-17 (×4): qty 1

## 2014-05-17 MED ORDER — SODIUM CHLORIDE 0.9 % IJ SOLN: 3.0000 mL | Freq: Two times a day (BID) | INTRAMUSCULAR | Status: DC

## 2014-05-17 MED ORDER — HEPARIN SODIUM (PORCINE) 5000 UNIT/ML IJ SOLN
5000.0000 [IU] | Freq: Three times a day (TID) | INTRAMUSCULAR | Status: DC
  Administered 2014-05-17 (×2): 5000 [IU] via SUBCUTANEOUS
  Filled 2014-05-17 (×2): qty 1

## 2014-05-17 MED ORDER — DEXTROSE 5 % IV SOLN
1.0000 g | INTRAVENOUS | Status: DC
  Administered 2014-05-18: 1 g via INTRAVENOUS
  Filled 2014-05-17 (×2): qty 10

## 2014-05-17 MED ORDER — ASPIRIN 325 MG PO TABS
325.0000 mg | ORAL_TABLET | Freq: Every day | ORAL | Status: DC
  Administered 2014-05-17 – 2014-05-18 (×2): 325 mg via ORAL
  Filled 2014-05-17 (×2): qty 1

## 2014-05-17 MED ORDER — HYDROCODONE-ACETAMINOPHEN 10-325 MG PO TABS: 0.5000 | ORAL_TABLET | ORAL | Status: DC | PRN

## 2014-05-17 MED ORDER — SODIUM CHLORIDE 0.9 % IV SOLN
INTRAVENOUS | Status: DC
  Administered 2014-05-17: 75 mL/h via INTRAVENOUS

## 2014-05-17 MED ORDER — PROMETHAZINE HCL 25 MG PO TABS: 25.0000 mg | ORAL_TABLET | Freq: Four times a day (QID) | ORAL | Status: DC | PRN

## 2014-05-17 MED ORDER — ACETAMINOPHEN 650 MG RE SUPP: 650.0000 mg | Freq: Four times a day (QID) | RECTAL | Status: DC | PRN

## 2014-05-17 MED ORDER — GUAIFENESIN-DM 100-10 MG/5ML PO SYRP: 5.0000 mL | ORAL_SOLUTION | ORAL | Status: DC | PRN

## 2014-05-17 MED ORDER — CHLORHEXIDINE GLUCONATE CLOTH 2 % EX PADS
6.0000 | MEDICATED_PAD | Freq: Once | CUTANEOUS | Status: AC
Start: 2014-05-17 — End: 2014-05-17
  Administered 2014-05-17: 6 via TOPICAL

## 2014-05-17 MED ORDER — INSULIN ASPART 100 UNIT/ML ~~LOC~~ SOLN
0.0000 [IU] | Freq: Three times a day (TID) | SUBCUTANEOUS | Status: DC
  Administered 2014-05-17 (×2): 2 [IU] via SUBCUTANEOUS
  Administered 2014-05-18: 1 [IU] via SUBCUTANEOUS

## 2014-05-17 MED ORDER — POTASSIUM CHLORIDE 10 MEQ/100ML IV SOLN
10.0000 meq | INTRAVENOUS | Status: AC
  Administered 2014-05-17 (×4): 10 meq via INTRAVENOUS
  Filled 2014-05-17 (×4): qty 100

## 2014-05-17 MED ORDER — ATORVASTATIN CALCIUM 20 MG PO TABS
20.0000 mg | ORAL_TABLET | Freq: Every day | ORAL | Status: DC
  Administered 2014-05-17: 20 mg via ORAL
  Filled 2014-05-17: qty 1

## 2014-05-17 MED ORDER — NITROGLYCERIN 0.4 MG SL SUBL: 0.4000 mg | SUBLINGUAL_TABLET | SUBLINGUAL | Status: DC | PRN

## 2014-05-17 MED ORDER — LISINOPRIL 20 MG PO TABS: 20.0000 mg | ORAL_TABLET | Freq: Every day | ORAL | Status: DC

## 2014-05-17 NOTE — Progress Notes (Signed)
[facility] PROGRESS NOTE  Sharon May DGL:[id number] DOB: 11-04-1970 DOA: 05/16/2014 PCP: No PCP Per Patient  Assessment/Plan: 1. Nausea/vomiting -Unclear etiology, could be secondary to underlying infectious process, with urine analysis showing presence of urinary tract infection -Symptoms resolving, advancing her diet today. -Continue IV fluid resuscitation  2.  Hypokalemia.  -Labs showing showing potassium of 3.0, likely secondary to GI losses from nausea and vomiting -She refused by mouth potassium, will provide IV potassium supplementation -Repeat labs in a.m.  3. Acute kidney injury. -Likely resulting from GI loss -Labs showing creatinine of 1.3, improving to 1.07 with demonstration of IV fluids  4.  Borderline troponin -Lab showing troponin of 0.34, less than 0.3 on follow troponin level -She denies chest pain -Continue monitoring  5.  Urinary tract infection -UA consistent with urinary tract infection -Continue empiric IV antimicrobial therapy with Rocephin IV  Code Status: Full code Family Communication:  Disposition Plan: Advancing diet, place and potassium, anticipate discharge in the next 24-48 hours if she continues to show clinical improvement   Antibiotics:  Ceftriaxone (started on 05/17/2014)  HPI/Subjective: Patient is a pleasant 43 year old female with a past medical history of type 2 diabetes mellitus, hypertension, congestive heart failure, admitted to the medicine service on 05/17/2014 presenting with complaints of nausea, vomiting. Initial lab work showed hypokalemia with potassium of 3.0, as well as acute kidney injury with creatinine of 1.3. Urine showed the presence of many bacteria with large amount of leukocytes. She was started on empiric IV antibiotic therapy with ceftriaxone  Objective: Filed Vitals:   05/17/14 1344  BP: 117/65  Pulse: 73  Temp: 98.1 F (36.7 C)  Resp: 20    Intake/Output Summary (Last 24 hours) at 05/17/14  1347 Last data filed at 05/17/14 1047  Gross per 24 hour  Intake    275 ml  Output    700 ml  Net   -425 ml   Filed Weights   05/16/14 1922 05/17/14 0036  Weight: 63.192 kg (139 lb 5 oz) 63.231 kg (139 lb 6.4 oz)    Exam:   General:  No acute distress, awake and alert  Cardiovascular: regular rate and rhythm normal S1S2  Respiratory: Clear to ausculation bilaterally  Abdomen: Soft nontender nondistended  Musculoskeletal: no edema  Data Reviewed: Basic Metabolic Panel:  Recent Labs Lab 05/16/14 2028 05/17/14 0340  NA 138 141  K 3.3* 3.0*  CL 95* 100  CO2 22 23  GLUCOSE 164* 118*  BUN 31* 25*  CREATININE 1.30* 1.07  CALCIUM 10.4 9.0   Liver Function Tests:  Recent Labs Lab 05/16/14 2028 05/17/14 0340  AST 19 13  ALT 14 11  ALKPHOS 75 63  BILITOT 0.3 0.3  PROT 8.9* 7.1  ALBUMIN 4.6 3.7    Recent Labs Lab 05/16/14 2028  LIPASE 154*   No results for input(s): AMMONIA in the last 168 hours. CBC:  Recent Labs Lab 05/16/14 2028 05/17/14 0340  WBC 13.2* 9.1  NEUTROABS 11.5*  --   HGB 11.9* 10.4*  HCT 33.5* 29.5*  MCV 80.7 79.3  PLT 299 297   Cardiac Enzymes:  Recent Labs Lab 05/16/14 2028 05/17/14 0340  TROPONINI 0.34* <0.30   BNP (last 3 results)  Recent Labs  05/17/14 0340  PROBNP 2215.0*   CBG:  Recent Labs Lab 05/17/14 0037 05/17/14 0738 05/17/14 1200  GLUCAP 129* 120* 154*    Recent Results (from the past 240 hour(s))  MRSA PCR Screening     Status: None  Collection Time: 05/17/14 12:50 AM  Result Value Ref Range Status   MRSA by PCR NEGATIVE NEGATIVE Final    Comment:        The GeneXpert MRSA Assay (FDA approved for NASAL specimens only), is one component of a comprehensive MRSA colonization surveillance program. It is not intended to diagnose MRSA infection nor to guide or monitor treatment for MRSA infections.      Studies: Dg Chest 2 View  05/16/2014   CLINICAL DATA:  Recently treated for urinary  tract infection. Increased blood pressure. History of hypertension, stroke and diabetes. Initial encounter.  EXAM: CHEST  2 VIEW  COMPARISON:  Radiographs 05/08/2014 and 03/16/2014.  FINDINGS: The heart size is at the upper limits of normal. The mediastinal contours are normal. The lungs are clear. There is no pleural effusion or pneumothorax. No osseous abnormalities are identified.  IMPRESSION: Borderline heart size.  No acute cardiopulmonary process.   Electronically Signed   By: Roxy HorsemanBill  Veazey M.D.   On: 05/16/2014 21:58    Scheduled Meds: . amLODipine  10 mg Oral Daily  . aspirin  325 mg Oral Daily  . atenolol  50 mg Oral Daily  . atorvastatin  20 mg Oral q1800  . cefTRIAXone (ROCEPHIN) IVPB 1 gram/50 mL D5W  1 g Intravenous Q24H  . cloNIDine  0.2 mg Oral BID  . heparin  5,000 Units Subcutaneous 3 times per day  . hydrALAZINE  25 mg Oral 3 times per day  . insulin aspart  0-9 Units Subcutaneous TID WC  . potassium chloride  10 mEq Intravenous Q1 Hr x 4  . sodium chloride  3 mL Intravenous Q12H   Continuous Infusions:   Principal Problem:   Pancreatitis, acute Active Problems:   Diabetes type 2, uncontrolled   UTI (urinary tract infection), uncomplicated   Hypertension   Stroke   CHF (congestive heart failure)   HLD (hyperlipidemia)   Elevated troponin   Essential hypertension    Time spent: 25 min    Jeralyn BennettZAMORA, Axle Parfait  [facility] Pager [phone]. If 7PM-7AM, please contact night-coverage at [email or web address], password [email or web address] 05/17/2014, 1:47 PM  LOS: 1 day

## 2014-05-17 NOTE — Plan of Care (Signed)
Problem: Phase I Progression Outcomes Goal: Pain controlled with appropriate interventions Outcome: Completed/Met Date Met:  05/17/14 Goal: OOB as tolerated unless otherwise ordered Outcome: Completed/Met Date Met:  05/17/14 Goal: Voiding-avoid urinary catheter unless indicated Outcome: Completed/Met Date Met:  05/17/14 Goal: Hemodynamically stable Outcome: Completed/Met Date Met:  05/17/14 Goal: Nausea/vomiting controlled after medication Outcome: Completed/Met Date Met:  05/17/14

## 2014-05-17 NOTE — Progress Notes (Signed)
INITIAL NUTRITION ASSESSMENT  DOCUMENTATION CODES Per approved criteria  -Non-severe (moderate) malnutrition in the context of chronic illness or injury   Pt meets criteria for moderate MALNUTRITION in the context of chronic illness as evidenced by mild muscle depletion, 13% wt loss x 6 months.  INTERVENTION: -RD to follow for diet advancement -Supplement diet as appropriate  NUTRITION DIAGNOSIS: Inadequate oral intake related to altered GI function as evidenced by NPO.   Goal: Pt will meet >90% of estimated needs  Monitor:  Diet advancement, PO intake, labs, weight changes, I/O's  Reason for Assessment: MST=2  43 y.o. female  Admitting Dx: Pancreatitis, acute  Patient is a pleasant 43 year old female with a past medical history of type 2 diabetes mellitus, hypertension, congestive heart failure, admitted to the medicine service on 05/17/2014 presenting with complaints of nausea, vomiting.   ASSESSMENT: Pt confirms poor appetite and weight loss for 1 months PTA after suffering a stroke. She reveals that she has lost approximately 5# (3.5%) in the past month. Noted a 13% wt loss over the past 6 months.  She reports that she ate lunch today and completed 50-75% of her tray. She reveals that she is currently NPO for testing. Noted MD plans to advance diet. She reports that she does not like Ensure or Glucerna supplements. Offered [employer]the[employer]ource Breeze, however, pt refused. She believes her appetite is improving enough to meet her needs. Discussed importance of good PO intake to  Preserve lean body mass and support healing process. Labs reviewed. K: 3/0 (to be repleted per MD notes), BUN: 25, Glucose: 118, CBGS: 120-154.   Nutrition Focused Physical Exam:  Subcutaneous Fat:  Orbital Region: WDL Upper Arm Region: WDL Thoracic and Lumbar Region: WDL  Muscle:  Temple Region: WDL Clavicle Bone Region: WDL Clavicle and Acromion Bone Region: WDL Scapular Bone Region: WDL Dorsal Hand:  WDL Patellar Region: mild depletion Anterior Thigh Region: mild depletion Posterior Calf Region: mild depletion  Edema: none present  Height: Ht Readings from Last 1 Encounters:  05/17/14 5\' 4"  (1.626 m)    Weight: Wt Readings from Last 1 Encounters:  05/17/14 139 lb 6.4 oz (63.231 kg)    Ideal Body Weight: 120#  % Ideal Body Weight: 116%  Wt Readings from Last 10 Encounters:  05/17/14 139 lb 6.4 oz (63.231 kg)  05/03/14 134 lb (60.782 kg)  03/14/14 160 lb (72.576 kg)  11/03/13 160 lb (72.576 kg)  09/12/12 170 lb (77.111 kg)  09/14/11 170 lb (77.111 kg)    Usual Body Weight: 170#  % Usual Body Weight: 82%  BMI:  Body mass index is 23.92 kg/(m^2). Normal weight range  Estimated Nutritional Needs: Kcal: 1600-1800 Protein: 75-85 grams Fluid: 1.6-1.8 L  Skin: WDL  Diet Order: Diet NPO time specified  EDUCATION NEEDS: -Education needs addressed   Intake/Output Summary (Last 24 hours) at 05/17/14 1602 Last data filed at 05/17/14 1047  Gross per 24 hour  Intake    275 ml  Output    700 ml  Net   -425 ml    Last BM: 05/16/14  Labs:   Recent Labs Lab 05/16/14 2028 05/17/14 0340  NA 138 141  K 3.3* 3.0*  CL 95* 100  CO2 22 23  BUN 31* 25*  CREATININE 1.30* 1.07  CALCIUM 10.4 9.0  GLUCOSE 164* 118*    CBG (last 3)   Recent Labs  05/17/14 0037 05/17/14 0738 05/17/14 1200  GLUCAP 129* 120* 154*    Scheduled Meds: . amLODipine  10 mg Oral Daily  . aspirin  325 mg Oral Daily  . atenolol  50 mg Oral Daily  . atorvastatin  20 mg Oral q1800  . cefTRIAXone (ROCEPHIN) IVPB 1 gram/50 mL D5W  1 g Intravenous Q24H  . cloNIDine  0.2 mg Oral BID  . heparin  5,000 Units Subcutaneous 3 times per day  . hydrALAZINE  25 mg Oral 3 times per day  . insulin aspart  0-9 Units Subcutaneous TID WC  . sodium chloride  3 mL Intravenous Q12H    Continuous Infusions:   Past Medical History  Diagnosis Date  . Hypertension   . Diabetes mellitus   .  Stroke     Past Surgical History  Procedure Laterality Date  . Cyst removed    . Tonsillectomy    . Tee without cardioversion N/A 03/18/2014    Procedure: TRANSESOPHAGEAL ECHOCARDIOGRAM (TEE);  Surgeon: Chrystie Nose, MD;  Location: [facility] ENDOSCOPY;  Service: Cardiovascular;  Laterality: N/A;    Tonda Wiederhold A. Mayford Knife, RD, LDN Pager: [phone] After hours Pager: [phone]

## 2014-05-17 NOTE — H&P (Signed)
[facility] History and Physical  Sharon SlatesSharon Balducci AVW:[id number] DOB: 06-10-1971 DOA: 05/16/2014  Referring physician: ED physician PCP: No PCP Per Patient  Specialists:   Chief Complaint: Nausea and vomiting  HPI: Sharon May is a 43 y.o. female with a past medical history of hypertension, stroke, diabetes mellitus, congestive heart failure, hyperlipidemia, who presented with the nausea no vomiting.  Patient reports that she started having nausea and vomiting since 6:00 PM. She does not have diarrhea and abdominal pain. She does not have fever or chills. Patient denies any symptoms for UTI, but urinalysis is positive for UTI in ED. She was also found to have elevated lipase at 154. Patient denies drinking alcohol or drug use. Patient denies any chest pain or shortness of breath, but her troponin is elevated at 0.34 in ED. She reports having mild sneezing nose, very mild dry cough. Her chest x-rays negative in ED. Patient has chronic right arm and leg weakness due to previous stroke. She does not have fever, chills, shortness breath, leg edema, rashes.  Patient is admitted as inpatient for further evaluation and treatment.  Review of Systems: As presented in the history of presenting illness, rest negative.  Where does patient live?  At home with grandmother Can patient participate in ADLs? Yes  Allergy:  Allergies  Allergen Reactions  . Morphine And Related Itching    Past Medical History  Diagnosis Date  . Hypertension   . Diabetes mellitus   . Stroke     Past Surgical History  Procedure Laterality Date  . Cyst removed    . Tonsillectomy    . Tee without cardioversion N/A 03/18/2014    Procedure: TRANSESOPHAGEAL ECHOCARDIOGRAM (TEE);  Surgeon: Chrystie NoseKenneth C. Hilty, MD;  [facility];  Service: Cardiovascular;  Laterality: N/A;    Social History:  reports that she has never smoked. She does not have any smokeless tobacco history on file. She reports that she does  not drink alcohol or use illicit drugs.  Family History:  Family History  Problem Relation Age of Onset  . Diabetes Mother   . Diabetes Father   . Hypertension Brother      Prior to Admission medications   Medication Sig Start Date End Date Taking? Authorizing Provider  amLODipine (NORVASC) 5 MG tablet Take 1 tablet (5 mg total) by mouth daily. 03/20/14   Layne BentonSharon L Biby, NP  aspirin 325 MG tablet Take 1 tablet (325 mg total) by mouth daily. 03/20/14   Layne BentonSharon L Biby, NP  atenolol (TENORMIN) 50 MG tablet Take 50 mg by mouth daily.    Historical Provider, MD  atorvastatin (LIPITOR) 20 MG tablet Take 1 tablet (20 mg total) by mouth daily at 6 PM. 03/20/14   Layne BentonSharon L Biby, NP  cloNIDine (CATAPRES) 0.2 MG tablet Take 0.2 mg by mouth 2 (two) times daily.    Historical Provider, MD  hydrALAZINE (APRESOLINE) 25 MG tablet Take 25 mg by mouth 3 (three) times daily.    Historical Provider, MD  hydrochlorothiazide (HYDRODIURIL) 25 MG tablet Take 25 mg by mouth daily.    Historical Provider, MD  HYDROcodone-acetaminophen (NORCO) 10-325 MG per tablet Take 0.5-1 tablets by mouth every 4 (four) hours as needed (for pain; may cause constipation). 11/04/13   John L Molpus, MD  lisinopril (PRINIVIL,ZESTRIL) 20 MG tablet Take 20 mg by mouth daily.    Historical Provider, MD  metFORMIN (GLUCOPHAGE) 500 MG tablet Take 500 mg by mouth 2 (two) times daily with a meal.  Historical Provider, MD  promethazine (PHENERGAN) 25 MG tablet Take 1 tablet (25 mg total) by mouth every 6 (six) hours as needed for nausea or vomiting. 11/04/13   Hanley Seamen, MD    Physical Exam: Filed Vitals:   05/16/14 2300 05/17/14 0036 05/17/14 0140 05/17/14 0240  BP: 175/80 158/84 142/77 135/66  Pulse: 93 84 75 75  Temp:  97.8 F (36.6 C)    TempSrc:  Oral    Resp: 18 15 15 20   Height:  5\' 4"  (1.626 m)    Weight:  63.231 kg (139 lb 6.4 oz)    SpO2: 100% 100% 100% 100%   General: Not in acute distress HEENT:       Eyes: PERRL,  EOMI, no scleral icterus       ENT: No discharge from the ears and nose, no pharynx injection, no tonsillar enlargement.        Neck: No JVD, no bruit, no mass felt. Cardiac: S1/S2, RRR, No murmurs, No gallops or rubs Pulm: Good air movement bilaterally. Clear to auscultation bilaterally. No rales, wheezing, rhonchi or rubs. Abd: Soft, nondistended, nontender, no rebound pain, no organomegaly, BS present Ext: No edema bilaterally. 2+DP/PT pulse bilaterally Musculoskeletal: No joint deformities, erythema, or stiffness, ROM full Skin: No rashes.  Neuro: Alert and oriented X3, cranial nerves II-XII grossly intact, muscle strength 4/5 in the right arm and leg. Sensation to light touch intact. Brachial reflex 2+ bilaterally. Knee reflex 1+ bilaterally. Negative Babinski's sign. Normal finger to nose test. Psych: Patient is not psychotic, no suicidal or hemocidal ideation.  Labs on Admission:  Basic Metabolic Panel:  Recent Labs Lab 05/16/14 2028  NA 138  K 3.3*  CL 95*  CO2 22  GLUCOSE 164*  BUN 31*  CREATININE 1.30*  CALCIUM 10.4   Liver Function Tests:  Recent Labs Lab 05/16/14 2028  AST 19  ALT 14  ALKPHOS 75  BILITOT 0.3  PROT 8.9*  ALBUMIN 4.6    Recent Labs Lab 05/16/14 2028  LIPASE 154*   No results for input(s): AMMONIA in the last 168 hours. CBC:  Recent Labs Lab 05/16/14 2028 05/17/14 0340  WBC 13.2* 9.1  NEUTROABS 11.5*  --   HGB 11.9* 10.4*  HCT 33.5* 29.5*  MCV 80.7 79.3  PLT 299 297   Cardiac Enzymes:  Recent Labs Lab 05/16/14 2028 05/17/14 0340  TROPONINI 0.34* <0.30    BNP (last 3 results)  Recent Labs  05/17/14 0340  PROBNP 2215.0*   CBG:  Recent Labs Lab 05/17/14 0037  GLUCAP 129*    Radiological Exams on Admission: Dg Chest 2 View  05/16/2014   CLINICAL DATA:  Recently treated for urinary tract infection. Increased blood pressure. History of hypertension, stroke and diabetes. Initial encounter.  EXAM: CHEST  2 VIEW   COMPARISON:  Radiographs 05/08/2014 and 03/16/2014.  FINDINGS: The heart size is at the upper limits of normal. The mediastinal contours are normal. The lungs are clear. There is no pleural effusion or pneumothorax. No osseous abnormalities are identified.  IMPRESSION: Borderline heart size.  No acute cardiopulmonary process.   Electronically Signed   By: Roxy Horseman M.D.   On: 05/16/2014 21:58    EKG: Independently reviewed. T-wave inversion in V5 and V6 which he existed in previous EKG on 05/03/14.  Assessment/Plan Principal Problem:   Pancreatitis, acute Active Problems:   Diabetes type 2, uncontrolled   UTI (urinary tract infection), uncomplicated   Hypertension   Stroke   CHF (  congestive heart failure)   HLD (hyperlipidemia)   Elevated troponin   Essential hypertension  Acute pancreatitis: Patient's nausea and vomiting are likely due to acute pancreatitis given elevated lipase. Etiology is not clear. Patient denies alcohol drinking. she does not have any abdominal pain. Currently patient is hemodynamically stable. HCTZ side effect is also considered. - will admit to tele bed due to elevated troponin - NPo - check FLP to rule out hypertriglyceridemia - Check alcohol level - US-abd - IVF: Patient received 2 L of normal saline in ED, will slow down to 75 cc/h given her EF of 40-45%. - hold HCTZ  - check pregnancy test  Elevated troponin: Patient does not have any chest pain. Given her significant risk factors, including hypertension, diabetes, congestive heart failure, hyperlipidemia, stroke, it is important to rule out ACS. Cardio recommend to trend trops and call if worsening. - trop x 3 - ASA, lipitor atenolol, NTG prn  Combined diastolic and systolic congestive heart failure: Patient's proBNP is elevated, but she is clinically euvolemic. She does not have any leg edema. O2 sat is 100% on room air. -will continue gentle fluid for pancreatitis -Observe volume status  carefully  DM-II: (8.7 on 03/15/14. On metformin at home. - Switch metformin to SSI  Acute renal failure: Creatinine increased from 1.1-1.3 on admission. This likely due to prerenal - IV fluid - Check FeUrea - hold lisinopril  HTN:  -Continue amlodipine, atenolol, clonidine, and hydralazine -Hold the lisinopril  UTI: Although patient does not have symptoms for UTI, her nausea and vomiting may be related to urinary tract infection, therefore I will treat her with antibiotics -Rocephin IV -Urine culture and blood culture  Hypokalemia: Potassium 3.3 on admission -Potassium chloride 40 mEq 1 oral  History of stroke: Residual weakness on the right arm and leg.  -Aspirin, Lipitor,    DVT ppx: SQ Heparin    Code Status: Full code Family Communication:  Yes, patient's grandma and aunt     at bed side Disposition Plan: Admit to inpatient   Date of Service 05/17/2014    Lorretta Harp [facility] Pager [phone]  If 7PM-7AM, please contact night-coverage [email or web address] Password [email or web address] 05/17/2014, 5:06 AM

## 2014-05-17 NOTE — Progress Notes (Signed)
Patient refused to take potassium po , states she does not want any potassium replacement due her potassium was elevated and it was discontinued . States she will eat a banana, educated her on the fact that she is NPO and her K+ level is 3.3. She was still adamant about not taking it. Elray Mcgregor NP notified with [facility]. [facility] [employer]

## 2014-05-18 DIAGNOSIS — R112 Nausea with vomiting, unspecified: Secondary | ICD-10-CM | POA: Diagnosis present

## 2014-05-18 DIAGNOSIS — E785 Hyperlipidemia, unspecified: Secondary | ICD-10-CM

## 2014-05-18 LAB — BASIC METABOLIC PANEL
Anion gap: 16 — ABNORMAL HIGH (ref 5–15)
BUN: 18 mg/dL (ref 6–23)
CO2: 23 mEq/L (ref 19–32)
Calcium: 8.5 mg/dL (ref 8.4–10.5)
Chloride: 103 mEq/L (ref 96–112)
Creatinine, Ser: 1.14 mg/dL — ABNORMAL HIGH (ref 0.50–1.10)
GFR calc non Af Amer: 58 mL/min — ABNORMAL LOW (ref >90)
GFR, EST AFRICAN AMERICAN: 67 mL/min — AB (ref >90)
GLUCOSE: 138 mg/dL — AB (ref 70–99)
POTASSIUM: 3.5 meq/L — AB (ref 3.7–5.3)
Sodium: 142 mEq/L (ref 137–147)

## 2014-05-18 LAB — URINE CULTURE: Colony Count: 85000

## 2014-05-18 LAB — UREA NITROGEN, URINE: Urea Nitrogen, Ur: 754 mg/dL

## 2014-05-18 LAB — CBC
HCT: 31.8 % — ABNORMAL LOW (ref 36.0–46.0)
HEMOGLOBIN: 10.9 g/dL — AB (ref 12.0–15.0)
MCH: 27.6 pg (ref 26.0–34.0)
MCHC: 34.3 g/dL (ref 30.0–36.0)
MCV: 80.5 fL (ref 78.0–100.0)
Platelets: 277 10*3/uL (ref 150–400)
RBC: 3.95 MIL/uL (ref 3.87–5.11)
RDW: 12.2 % (ref 11.5–15.5)
WBC: 5.5 10*3/uL (ref 4.0–10.5)

## 2014-05-18 LAB — GLUCOSE, CAPILLARY: Glucose-Capillary: 139 mg/dL — ABNORMAL HIGH (ref 70–99)

## 2014-05-18 MED ORDER — CEFUROXIME AXETIL 250 MG PO TABS: 500.0000 mg | ORAL_TABLET | Freq: Two times a day (BID) | ORAL | Status: DC

## 2014-05-18 NOTE — Plan of Care (Signed)
Problem: Consults Goal: Pancreatitis Patient Education See Patient Education Module for education specifics.  Outcome: Completed/Met Date Met:  05/18/14 Goal: Skin Care Protocol Initiated - if Braden Score 18 or less If consults are not indicated, leave blank or document N/A  Outcome: Completed/Met Date Met:  05/18/14 Goal: Nutrition Consult-if indicated Outcome: Completed/Met Date Met:  05/18/14 Goal: Diabetes Guidelines if Diabetic/Glucose > 140 If diabetic or lab glucose is > 140 mg/dl - Initiate Diabetes/Hyperglycemia Guidelines & Document Interventions  Outcome: Not Applicable Date Met:  14/43/15  Problem: Phase I Progression Outcomes Goal: Initial discharge plan identified Outcome: Completed/Met Date Met:  05/18/14 Goal: Other Phase I Outcomes/Goals Outcome: Completed/Met Date Met:  05/18/14  Problem: Phase II Progression Outcomes Goal: Progress activity as tolerated unless otherwise ordered Outcome: Completed/Met Date Met:  05/18/14 Goal: Discharge plan established Outcome: Completed/Met Date Met:  05/18/14 Goal: Tolerates PO clear liquids Outcome: Completed/Met Date Met:  05/18/14 Goal: Nausea/vomiting controlled with medication Outcome: Completed/Met Date Met:  05/18/14 Goal: Blood sugar < 150 Outcome: Completed/Met Date Met:  05/18/14 Goal: Other Phase II Outcomes/Goals Outcome: Completed/Met Date Met:  05/18/14  Problem: Phase III Progression Outcomes Goal: Pain controlled on oral analgesia Outcome: Completed/Met Date Met:  05/18/14 Goal: Activity at appropriate level-compared to baseline (UP IN CHAIR FOR HEMODIALYSIS)  Outcome: Completed/Met Date Met:  05/18/14 Goal: Tolerating diet Outcome: Completed/Met Date Met:  05/18/14 Goal: Absence of DT's (Delirium Tremors) Outcome: Completed/Met Date Met:  05/18/14 Goal: Amylase, WBC trending downward Outcome: Completed/Met Date Met:  05/18/14 Goal: Discharge plan remains appropriate-arrangements made Outcome:  Completed/Met Date Met:  05/18/14 Goal: Other Phase III Outcomes/Goals Outcome: Completed/Met Date Met:  05/18/14  Problem: Discharge Progression Outcomes Goal: Barriers To Progression Addressed/Resolved Outcome: Completed/Met Date Met:  05/18/14 Goal: Discharge plan in place and appropriate Outcome: Completed/Met Date Met:  05/18/14 Goal: Pain controlled with appropriate interventions Outcome: Completed/Met Date Met:  05/18/14 Goal: Hemodynamically stable Outcome: Completed/Met Date Met:  05/18/14 Goal: Tolerating diet Outcome: Completed/Met Date Met:  05/18/14 Goal: Activity appropriate for discharge plan Outcome: Completed/Met Date Met:  05/18/14 Goal: Afebrile times 24 hours Outcome: Completed/Met Date Met:  05/18/14 Goal: Amylase, WBC trending downward Outcome: Completed/Met Date Met:  05/18/14 Goal: Pt. accepted/substance abuse rehab Outcome: Completed/Met Date Met:  05/18/14 Goal: Other Discharge Outcomes/Goals Outcome: Completed/Met Date Met:  05/18/14

## 2014-05-18 NOTE — Progress Notes (Signed)
Utilization Review Completed.Sharon May T11/22/2015  

## 2014-05-18 NOTE — Discharge Instructions (Signed)
Cardiac Diet A cardiac diet can help stop heart disease or a stroke from happening. It involves eating less unhealthy fats and eating more healthy fats.  FOODS TO AVOID OR LIMIT  Limit saturated fats. This type of fat is found in oils and dairy products, such as:  Coconut oil.  Palm oil.  Cocoa butter.  Butter.  Avoid trans-fat or hydrogenated oils. These are found in fried or pre-made baked goods, such as:  Margarine.  Pre-made cookies, cakes, and crackers.  Limit processed meats (hot dogs, deli meats, sausage) to 3 ounces a week.  Limit high-fat meats (marbled meats, fried chicken, or chicken with skin) to 3 ounces a week.  Limit salt (sodium) to 1500 milligrams a day.   Limit sweets and drinks with added sugar to no more than 5 servings a week. One serving is:  1 tablespoon of sugar.  1 tablespoon of jelly or jam.   cup sorbet.  1 cup lemonade.   cup regular soda. EAT MORE OF THE FOLLOWING FOODS Fruit  Eat 4to 5 servings a day. One serving of fruit is:  1 medium whole fruit.   cup dried fruit.   cup of fresh, frozen, or canned fruit.   cup 100% fruit juice. Vegetables  Eat 4 to 5 servings a day. One serving is:  1 cup raw leafy vegetables.   cup raw or cooked, cut-up vegetables.   cup vegetable juice. Whole Grains  Eat 3 servings a day (1 ounce equals 1 serving). Legumes (such as beans, peas, and lentils)   Eat at least 4 servings a week ( cup equals 1 serving). Nuts and Seeds   Eat at least 4 servings a week ( cup equals 1 serving). Dietary Fiber  Eat 20 to 30 grams a day. Some foods high in dietary fiber include:  Dried beans.  Citrus fruits.  Apples, bananas.  Broccoli, Brussels sprouts, and eggplant.  Oats. Omega-3 Fats  Eat food with omega-3 fats. You can also take a dietary pill (supplement) that has 1 gram of DHA and EPA. Have 3.5 ounces of fatty fish a week, such as:  Salmon.  Mackerel.  [place]  tuna.  Sardines.  Lake trout.  Herring. PREPARING YOUR FOOD  Broil, bake, steam, or roast foods. Do not fry food. Do not cook food in butter (fat).  Use non-stick cooking sprays.  Remove skin from poultry, such as chicken and [place].  Remove fat from meat.  Take the fat off the top of stews, soups, and gravy.  Use lemon or herbs to flavor food instead of using butter or margarine.  Use nonfat yogurt, salsa, or low-fat dressings for salads. Document Released: 12/13/2011 Document Reviewed: 12/13/2011 [facility] Patient Information 2015 [employer], [place]. This information is not intended to replace advice given to you by your health care provider. Make sure you discuss any questions you have with your health care provider.     Acute Pancreatitis Acute pancreatitis is a disease in which the pancreas becomes suddenly irritated (inflamed). The pancreas is a large gland behind your stomach. The pancreas makes enzymes that help digest food. The pancreas also makes 2 hormones that help control your blood sugar. Acute pancreatitis happens when the enzymes attack and damage the pancreas. Most attacks last a couple of days and can cause serious problems. HOME CARE  Follow your doctor's diet instructions. You may need to avoid alcohol and limit fat in your diet.  Eat small meals often.  Drink enough fluids to keep your  pee (urine) clear or pale yellow.  Only take medicines as told by your doctor.  Avoid drinking alcohol if it caused your disease.  Do not smoke.  Get plenty of rest.  Check your blood sugar at home as told by your doctor.  Keep all doctor visits as told. GET HELP IF:  You do not get better as quickly as expected.  You have new or worsening symptoms.  You have lasting pain, weakness, or feel sick to your stomach (nauseous).  You get better and then have another pain attack. GET HELP RIGHT AWAY IF:   You are unable to eat or keep fluids down.  Your pain  becomes severe.  You have a fever or lasting symptoms for more than 2 to 3 days.  You have a fever and your symptoms suddenly get worse.  Your skin or the white part of your eyes turn yellow (jaundice).  You throw up (vomit).  You feel dizzy, or you pass out (faint).  Your blood sugar is high (over 300 mg/dL). MAKE SURE YOU:   Understand these instructions.  Will watch your condition.  Will get help right away if you are not doing well or get worse. Document Released: 11/30/2007 Document Revised: 10/28/2013 Document Reviewed: 09/22/2011 [facility] Patient Information 2015 [place], [place]. This information is not intended to replace advice given to you by your health care provider. Make sure you discuss any questions you have with your health care provider.

## 2014-05-18 NOTE — Discharge Summary (Signed)
Physician Discharge Summary  Sharon May TML:[id number] DOB: Mar 12, 1971 DOA: 05/16/2014  PCP: No PCP Per Patient  Admit date: 05/16/2014 Discharge date: 05/18/2014  Time spent: 35 minutes  Recommendations for Outpatient Follow-up:  1. Please follow up on BMP on hospital follow, she was hypokalemic during this hospitalization and refused oral potassium, was given IV potassium   Discharge Diagnoses:  Principal Problem:   Nausea with vomiting Active Problems:   UTI (urinary tract infection), uncomplicated   Diabetes type 2, uncontrolled   Hypertension   Stroke   CHF (congestive heart failure)   HLD (hyperlipidemia)   Elevated troponin   Essential hypertension   Discharge Condition: Stable  Diet recommendation: Heart Healthy  Filed Weights   05/16/14 1922 05/17/14 0036 05/18/14 0500  Weight: 63.192 kg (139 lb 5 oz) 63.231 kg (139 lb 6.4 oz) 63.05 kg (139 lb)    History of present illness:  Sharon May is a 43 y.o. female with a past medical history of hypertension, stroke, diabetes mellitus, congestive heart failure, hyperlipidemia, who presented with the nausea no vomiting.  Patient reports that she started having nausea and vomiting since 6:00 PM. She does not have diarrhea and abdominal pain. She does not have fever or chills. Patient denies any symptoms for UTI, but urinalysis is positive for UTI in ED. She was also found to have elevated lipase at 154. Patient denies drinking alcohol or drug use. Patient denies any chest pain or shortness of breath, but her troponin is elevated at 0.34 in ED. She reports having mild sneezing nose, very mild dry cough. Her chest x-rays negative in ED. Patient has chronic right arm and leg weakness due to previous stroke. She does not have fever, chills, shortness breath, leg edema, rashes.  Hospital Course:  Patient is a pleasant 43 year old female with a past medical history of type 2 diabetes mellitus, hypertension, congestive heart  failure, admitted to the medicine service on 05/17/2014 presenting with complaints of nausea, vomiting. Initial lab work showed hypokalemia with potassium of 3.0, as well as acute kidney injury with creatinine of 1.3. Urine showed the presence of many bacteria with large amount of leukocytes. She was started on empiric IV antibiotic therapy with ceftriaxone. Nausea vomiting possibly related to underlying UTI vs infectious gastroenteritis. AKI resolved with the administration of IV fluids. Potassium improved to 3.5 from 3.0 after receiving IV potassium. She reported significant improvement by 05/18/2014 as she was tolerating regular diet. She was discharged to her home in stable condition on 05/18/2014.    Discharge Exam: Filed Vitals:   05/18/14 0535  BP: 122/70  Pulse: 59  Temp:   Resp:     General: No acute distress, awake and alert  Cardiovascular: regular rate and rhythm normal S1S2  Respiratory: Clear to ausculation bilaterally  Abdomen: Soft nontender nondistended  Musculoskeletal: no edema  Discharge Instructions You were cared for by a hospitalist during your hospital stay. If you have any questions about your discharge medications or the care you received while you were in the hospital after you are discharged, you can call the unit and asked to speak with the hospitalist on call if the hospitalist that took care of you is not available. Once you are discharged, your primary care physician will handle any further medical issues. Please note that NO REFILLS for any discharge medications will be authorized once you are discharged, as it is imperative that you return to your primary care physician (or establish a relationship with a primary care physician  if you do not have one) for your aftercare needs so that they can reassess your need for medications and monitor your lab values.  Discharge Instructions    Call MD for:  difficulty breathing, headache or visual disturbances     Complete by:  As directed      Call MD for:  extreme fatigue    Complete by:  As directed      Call MD for:  hives    Complete by:  As directed      Call MD for:  persistant dizziness or light-headedness    Complete by:  As directed      Call MD for:  persistant nausea and vomiting    Complete by:  As directed      Call MD for:  redness, tenderness, or signs of infection (pain, swelling, redness, odor or green/yellow discharge around incision site)    Complete by:  As directed      Call MD for:  severe uncontrolled pain    Complete by:  As directed      Call MD for:  temperature >100.4    Complete by:  As directed      Diet - low sodium heart healthy    Complete by:  As directed      Increase activity slowly    Complete by:  As directed           Current Discharge Medication List    START taking these medications   Details  cefUROXime (CEFTIN) 250 MG tablet Take 2 tablets (500 mg total) by mouth 2 (two) times daily with a meal. Qty: 6 tablet, Refills: 0      CONTINUE these medications which have NOT CHANGED   Details  amLODipine (NORVASC) 10 MG tablet Take 10 mg by mouth daily.  Refills: 3    aspirin 325 MG tablet Take 1 tablet (325 mg total) by mouth daily. Qty: 30 tablet, Refills: 2    atenolol (TENORMIN) 50 MG tablet Take 50 mg by mouth daily.    atorvastatin (LIPITOR) 20 MG tablet Take 1 tablet (20 mg total) by mouth daily at 6 PM. Qty: 30 tablet, Refills: 2    cloNIDine (CATAPRES) 0.2 MG tablet Take 0.2 mg by mouth 2 (two) times daily.    Dextromethorphan-Quinidine 20-10 MG CAPS Take 1 capsule by mouth daily.    hydrALAZINE (APRESOLINE) 25 MG tablet Take 75 mg by mouth 4 (four) times daily.     metFORMIN (GLUCOPHAGE) 500 MG tablet Take 1,000 mg by mouth 2 (two) times daily with a meal.     metoprolol succinate (TOPROL-XL) 100 MG 24 hr tablet Take 100 mg by mouth daily.     HYDROcodone-acetaminophen (NORCO) 10-325 MG per tablet Take 0.5-1 tablets by mouth  every 4 (four) hours as needed (for pain; may cause constipation). Qty: 30 tablet, Refills: 0    promethazine (PHENERGAN) 25 MG tablet Take 1 tablet (25 mg total) by mouth every 6 (six) hours as needed for nausea or vomiting. Qty: 20 tablet, Refills: 0      STOP taking these medications     hydrochlorothiazide (HYDRODIURIL) 25 MG tablet      lisinopril (PRINIVIL,ZESTRIL) 20 MG tablet      naproxen (NAPROSYN) 500 MG tablet      potassium chloride (K-DUR) 10 MEQ tablet        Allergies  Allergen Reactions  . Morphine And Related Itching   Follow-up Information    Follow up with  No PCP Per Patient In 1 week.   Specialty:  General Practice       The results of significant diagnostics from this hospitalization (including imaging, microbiology, ancillary and laboratory) are listed below for reference.    Significant Diagnostic Studies: Dg Chest 2 View  05/16/2014   CLINICAL DATA:  Recently treated for urinary tract infection. Increased blood pressure. History of hypertension, stroke and diabetes. Initial encounter.  EXAM: CHEST  2 VIEW  COMPARISON:  Radiographs 05/08/2014 and 03/16/2014.  FINDINGS: The heart size is at the upper limits of normal. The mediastinal contours are normal. The lungs are clear. There is no pleural effusion or pneumothorax. No osseous abnormalities are identified.  IMPRESSION: Borderline heart size.  No acute cardiopulmonary process.   Electronically Signed   By: Roxy Horseman M.D.   On: 05/16/2014 21:58   US Abdomen Complete  05/17/2014   CLINICAL DATA:  43 year old female with abdominal pain and vomiting.  EXAM: ULTRASOUND ABDOMEN COMPLETE  COMPARISON:  None.  FINDINGS: Gallbladder: Gallbladder sludge is noted. There is no evidence of cholelithiasis or acute cholecystitis.  Common bile duct: Diameter: 4.5 mm. There is no evidence of intrahepatic or extrahepatic biliary dilatation.  Liver: No focal lesion identified. Within normal limits in parenchymal  echogenicity.  IVC: No abnormality visualized.  Pancreas: Visualized portion unremarkable.  Spleen: Not well visualized secondary to overlying bowel gas.  Right Kidney: Length: 10.4 cm. Echogenicity within normal limits. No mass or hydronephrosis visualized.  Left Kidney: Not well visualized secondary to overlying bowel gas.  Abdominal aorta: No aneurysm visualized.  Other findings: None.  IMPRESSION: Gallbladder sludge without cholelithiasis or evidence of acute cholecystitis.  No other abnormalities identified. Please note that the spleen and left kidney are not well visualized secondary to overlying bowel gas.   Electronically Signed   By: Laveda Abbe M.D.   On: 05/17/2014 22:01    Microbiology: Recent Results (from the past 240 hour(s))  MRSA PCR Screening     Status: None   Collection Time: 05/17/14 12:50 AM  Result Value Ref Range Status   MRSA by PCR NEGATIVE NEGATIVE Final    Comment:        The GeneXpert MRSA Assay (FDA approved for NASAL specimens only), is one component of a comprehensive MRSA colonization surveillance program. It is not intended to diagnose MRSA infection nor to guide or monitor treatment for MRSA infections.      Labs: Basic Metabolic Panel:  Recent Labs Lab 05/16/14 2028 05/17/14 0340 05/18/14 0455  NA 138 141 142  K 3.3* 3.0* 3.5*  CL 95* 100 103  CO2 22 23 23   GLUCOSE 164* 118* 138*  BUN 31* 25* 18  CREATININE 1.30* 1.07 1.14*  CALCIUM 10.4 9.0 8.5   Liver Function Tests:  Recent Labs Lab 05/16/14 2028 05/17/14 0340  AST 19 13  ALT 14 11  ALKPHOS 75 63  BILITOT 0.3 0.3  PROT 8.9* 7.1  ALBUMIN 4.6 3.7    Recent Labs Lab 05/16/14 2028  LIPASE 154*   No results for input(s): AMMONIA in the last 168 hours. CBC:  Recent Labs Lab 05/16/14 2028 05/17/14 0340 05/18/14 0455  WBC 13.2* 9.1 5.5  NEUTROABS 11.5*  --   --   HGB 11.9* 10.4* 10.9*  HCT 33.5* 29.5* 31.8*  MCV 80.7 79.3 80.5  PLT 299 297 277   Cardiac  Enzymes:  Recent Labs Lab 05/16/14 2028 05/17/14 0340  TROPONINI 0.34* <0.30   BNP: BNP (last 3  results)  Recent Labs  05/17/14 0340  PROBNP 2215.0*   CBG:  Recent Labs Lab 05/17/14 0738 05/17/14 1200 05/17/14 1653 05/17/14 2137 05/18/14 0729  GLUCAP 120* 154* 164* 94 139*       Signed:  Jelani Vreeland  [facility] 05/18/2014, 10:20 AM

## 2014-05-23 LAB — CULTURE, BLOOD (ROUTINE X 2)
CULTURE: NO GROWTH
CULTURE: NO GROWTH

## 2015-11-01 ENCOUNTER — Encounter (HOSPITAL_BASED_OUTPATIENT_CLINIC_OR_DEPARTMENT_OTHER): Payer: Self-pay

## 2015-11-01 ENCOUNTER — Emergency Department (HOSPITAL_BASED_OUTPATIENT_CLINIC_OR_DEPARTMENT_OTHER)
Admission: EM | Admit: 2015-11-01 | Discharge: 2015-11-01 | Disposition: A | Discharge status: Home / Self Care | Payer: Medicaid Other | Attending: Emergency Medicine | Admitting: Emergency Medicine

## 2015-11-01 DIAGNOSIS — I10 Essential (primary) hypertension: Secondary | ICD-10-CM | POA: Insufficient documentation

## 2015-11-01 DIAGNOSIS — Z8673 Personal history of transient ischemic attack (TIA), and cerebral infarction without residual deficits: Secondary | ICD-10-CM | POA: Insufficient documentation

## 2015-11-01 DIAGNOSIS — E119 Type 2 diabetes mellitus without complications: Secondary | ICD-10-CM | POA: Diagnosis not present

## 2015-11-01 DIAGNOSIS — B9789 Other viral agents as the cause of diseases classified elsewhere: Secondary | ICD-10-CM

## 2015-11-01 DIAGNOSIS — L723 Sebaceous cyst: Secondary | ICD-10-CM

## 2015-11-01 DIAGNOSIS — L309 Dermatitis, unspecified: Secondary | ICD-10-CM | POA: Insufficient documentation

## 2015-11-01 DIAGNOSIS — J069 Acute upper respiratory infection, unspecified: Secondary | ICD-10-CM | POA: Diagnosis not present

## 2015-11-01 MED ORDER — DOXYCYCLINE HYCLATE 100 MG PO CAPS: 100.0000 mg | ORAL_CAPSULE | Freq: Two times a day (BID) | ORAL | Status: DC

## 2015-11-01 MED ORDER — GUAIFENESIN ER 1200 MG PO TB12: 1.0000 | ORAL_TABLET | Freq: Two times a day (BID) | ORAL | Status: DC

## 2015-11-01 MED ORDER — PROMETHAZINE-DM 6.25-15 MG/5ML PO SYRP: 5.0000 mL | ORAL_SOLUTION | Freq: Four times a day (QID) | ORAL | Status: DC | PRN

## 2015-11-01 NOTE — ED Notes (Signed)
Pt extremely vague in symptoms. Pt sts "I just feel bad." Reports cough and runny nose.

## 2015-11-01 NOTE — ED Provider Notes (Signed)
CSN: [id number]     Arrival date & time 11/01/15  1119 History   First MD Initiated Contact with Patient 11/01/15 1131     Chief Complaint  Patient presents with  . URI     (Consider location/radiation/quality/duration/timing/severity/associated sxs/prior Treatment) HPI Patient presents to the emergency department with an upper respiratory infection.  This been ongoing for the last 3 days.  Patient, states she has had cough, nasal congestion, sore throat.  The patient states that she did not take any medications prior to arrival.  She states nothing seems make her condition better or worse, states that she also has eczema and she is concerned because she has not seen her dermatologist for quite a while for her symptomsThe patient denies chest pain, shortness of breath, headache,blurred vision, neck pain, fever, , weakness, numbness, dizziness, anorexia, edema, abdominal pain, nausea, vomiting, diarrhea, rash, back pain, dysuria, hematemesis, bloody stool, near syncope, or syncope. Past Medical History  Diagnosis Date  . Hypertension   . Diabetes mellitus   . Stroke [facility])    Past Surgical History  Procedure Laterality Date  . Cyst removed    . Tonsillectomy    . Tee without cardioversion N/A 03/18/2014    Procedure: TRANSESOPHAGEAL ECHOCARDIOGRAM (TEE);  Surgeon: Chrystie Nose, MD;  Location: [facility] ENDOSCOPY;  Service: Cardiovascular;  Laterality: N/A;   Family History  Problem Relation Age of Onset  . Diabetes Mother   . Diabetes Father   . Hypertension Brother    Social History  Substance Use Topics  . Smoking status: Never Smoker   . Smokeless tobacco: None  . Alcohol Use: No   OB History    No data available     Review of Systems All other systems negative except as documented in the HPI. All pertinent positives and negatives as reviewed in the HPI.  Allergies  Morphine and related  Home Medications   Prior to Admission medications   Medication Sig Start Date End  Date Taking? Authorizing Provider  amLODipine (NORVASC) 10 MG tablet Take 10 mg by mouth daily.  04/24/14   Historical Provider, MD  aspirin 325 MG tablet Take 1 tablet (325 mg total) by mouth daily. 03/20/14   Layne Benton, NP  atenolol (TENORMIN) 50 MG tablet Take 50 mg by mouth daily.    Historical Provider, MD  atorvastatin (LIPITOR) 20 MG tablet Take 1 tablet (20 mg total) by mouth daily at 6 PM. 03/20/14   Layne Benton, NP  cefUROXime (CEFTIN) 250 MG tablet Take 2 tablets (500 mg total) by mouth 2 (two) times daily with a meal. 05/18/14   Jeralyn Bennett, MD  cloNIDine (CATAPRES) 0.2 MG tablet Take 0.2 mg by mouth 2 (two) times daily.    Historical Provider, MD  Dextromethorphan-Quinidine 20-10 MG CAPS Take 1 capsule by mouth daily.    Historical Provider, MD  hydrALAZINE (APRESOLINE) 25 MG tablet Take 75 mg by mouth 4 (four) times daily.     Historical Provider, MD  HYDROcodone-acetaminophen (NORCO) 10-325 MG per tablet Take 0.5-1 tablets by mouth every 4 (four) hours as needed (for pain; may cause constipation). Patient not taking: Reported on 05/17/2014 11/04/13   Paula Libra, MD  metFORMIN (GLUCOPHAGE) 500 MG tablet Take 1,000 mg by mouth 2 (two) times daily with a meal.     Historical Provider, MD  metoprolol succinate (TOPROL-XL) 100 MG 24 hr tablet Take 100 mg by mouth daily.  10/06/10   Historical Provider, MD  promethazine (PHENERGAN) 25  MG tablet Take 1 tablet (25 mg total) by mouth every 6 (six) hours as needed for nausea or vomiting. Patient not taking: Reported on 05/17/2014 11/04/13   John Molpus, MD   BP 135/80 mmHg  Pulse 68  Temp(Src) 98.5 F (36.9 C) (Oral)  Resp 20  Ht 5\' 4"  (1.626 m)  Wt 68.04 kg  BMI 25.73 kg/m2  SpO2 100% Physical Exam  Constitutional: She is oriented to person, place, and time. She appears well-developed and well-nourished. No distress.  HENT:  Head: Normocephalic and atraumatic.  Mouth/Throat: Oropharynx is clear and moist.  Eyes: Pupils are  equal, round, and reactive to light.  Neck: Normal range of motion. Neck supple.  Cardiovascular: Normal rate, regular rhythm and normal heart sounds.  Exam reveals no gallop and no friction rub.   No murmur heard. Pulmonary/Chest: Effort normal and breath sounds normal. No respiratory distress. She has no wheezes.  Neurological: She is alert and oriented to person, place, and time. She exhibits normal muscle tone. Coordination normal.  Skin: Skin is warm and dry. No rash noted. No erythema.  Psychiatric: She has a normal mood and affect. Her behavior is normal.  Nursing note and vitals reviewed.   ED Course  Procedures (including critical care time) Labs Review Labs Reviewed - No data to display  Imaging Review No results found. I have personally reviewed and evaluated these images and lab results as part of my medical decision-making.  Patient be treated for viral URI.  Told to return here as needed.  The patient is advised she will need to follow-up with her dermatologist for an injection that she requested here in the emergency department for her eczema which I am unclear as to what this injection would be.  She states she would get it@the  dermatologist last 6 months.  I advised the patient that she also has sebaceous cyst on her back that may need further care if it became fluctuant  Charlestine Night, PA-C 11/01/15 1204  Leta Baptist, MD 11/05/15 1651

## 2015-11-01 NOTE — Discharge Instructions (Signed)
You will need to follow-up with a primary care doctor.  Return here as needed.  Use heat on the area on your back

## 2016-06-03 ENCOUNTER — Encounter (HOSPITAL_BASED_OUTPATIENT_CLINIC_OR_DEPARTMENT_OTHER): Payer: Self-pay | Admitting: Emergency Medicine

## 2016-06-03 ENCOUNTER — Emergency Department (HOSPITAL_BASED_OUTPATIENT_CLINIC_OR_DEPARTMENT_OTHER)
Admission: EM | Admit: 2016-06-03 | Discharge: 2016-06-03 | Disposition: A | Discharge status: Home / Self Care | Payer: Medicaid Other | Attending: Emergency Medicine | Admitting: Emergency Medicine

## 2016-06-03 DIAGNOSIS — Z7982 Long term (current) use of aspirin: Secondary | ICD-10-CM | POA: Diagnosis not present

## 2016-06-03 DIAGNOSIS — I110 Hypertensive heart disease with heart failure: Secondary | ICD-10-CM | POA: Insufficient documentation

## 2016-06-03 DIAGNOSIS — E119 Type 2 diabetes mellitus without complications: Secondary | ICD-10-CM | POA: Diagnosis not present

## 2016-06-03 DIAGNOSIS — M10071 Idiopathic gout, right ankle and foot: Secondary | ICD-10-CM | POA: Diagnosis not present

## 2016-06-03 DIAGNOSIS — I509 Heart failure, unspecified: Secondary | ICD-10-CM | POA: Insufficient documentation

## 2016-06-03 DIAGNOSIS — M109 Gout, unspecified: Secondary | ICD-10-CM

## 2016-06-03 DIAGNOSIS — Z7984 Long term (current) use of oral hypoglycemic drugs: Secondary | ICD-10-CM | POA: Diagnosis not present

## 2016-06-03 DIAGNOSIS — M79671 Pain in right foot: Secondary | ICD-10-CM | POA: Diagnosis present

## 2016-06-03 MED ORDER — INDOMETHACIN 25 MG PO CAPS: 25.0000 mg | ORAL_CAPSULE | Freq: Three times a day (TID) | ORAL | 0 refills | Status: DC | PRN

## 2016-06-03 MED ORDER — HYDROCODONE-ACETAMINOPHEN 5-325 MG PO TABS: 1.0000 | ORAL_TABLET | Freq: Four times a day (QID) | ORAL | 0 refills | Status: DC | PRN

## 2016-06-03 MED FILL — HYDROCODON-APAP 5-325: 5-325 | 3 days supply | Qty: 20 | Fill #0

## 2016-06-03 MED FILL — INDOMETHACIN 25 MG CAPSULE: 25 | 5 days supply | Qty: 15 | Fill #0

## 2016-06-03 NOTE — ED Provider Notes (Signed)
[facility] Note   CSN: [id number] Arrival date & time: 06/03/16  0831     History   Chief Complaint Chief Complaint  Patient presents with  . Foot Pain    HPI Sharon May is a 45 y.o. female.  Patient is a 45 year old female with history of diabetes, hypertension, prior CVA. She presents for evaluation of right foot pain. This began yesterday evening in the absence of any injury or trauma. She denies any new activity that would have caused this. She denies any fevers or chills.      Past Medical History:  Diagnosis Date  . Diabetes mellitus   . Hypertension   . Stroke [facility](HCC)     Patient Active Problem List   Diagnosis Date Noted  . Nausea with vomiting 05/18/2014  . CHF (congestive heart failure) (HCC) 05/17/2014  . HLD (hyperlipidemia) 05/17/2014  . Elevated troponin 05/17/2014  . Pancreatitis, acute 05/17/2014  . Pancreatitis 05/17/2014  . Hypertension   . Stroke (HCC)   . Essential hypertension   . UTI (urinary tract infection), uncomplicated 03/20/2014  . Other and unspecified hyperlipidemia 03/19/2014  . Diabetes type 2, uncontrolled (HCC) 03/19/2014  . NICM (nonischemic cardiomyopathy) (HCC) 03/17/2014  . Accelerated hypertension 03/17/2014  . Sinus tachycardia 03/17/2014  . Demand ischemia (HCC) 03/17/2014  . 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 cyst removed    . TEE WITHOUT CARDIOVERSION N/A 03/18/2014   Procedure: TRANSESOPHAGEAL ECHOCARDIOGRAM (TEE);  Surgeon: Chrystie NoseKenneth C. Hilty, MD;  [facility];  Service: Cardiovascular;  Laterality: N/A;  . TONSILLECTOMY      OB History    No data available       Home Medications    Prior to Admission medications   Medication Sig Start Date End Date Taking? Authorizing Provider  glipiZIDE (GLUCOTROL) 10 MG tablet Take 10 mg by mouth daily before breakfast.   Yes Historical Provider, MD    amLODipine (NORVASC) 10 MG tablet Take 10 mg by mouth daily.  04/24/14   Historical Provider, MD  aspirin 325 MG tablet Take 1 tablet (325 mg total) by mouth daily. 03/20/14   Layne BentonSharon L Biby, NP  atenolol (TENORMIN) 50 MG tablet Take 50 mg by mouth daily.    Historical Provider, MD  atorvastatin (LIPITOR) 20 MG tablet Take 1 tablet (20 mg total) by mouth daily at 6 PM. 03/20/14   Layne BentonSharon L Biby, NP  cefUROXime (CEFTIN) 250 MG tablet Take 2 tablets (500 mg total) by mouth 2 (two) times daily with a meal. 05/18/14   Jeralyn BennettEzequiel Zamora, MD  cloNIDine (CATAPRES) 0.2 MG tablet Take 0.2 mg by mouth 2 (two) times daily.    Historical Provider, MD  Dextromethorphan-Quinidine 20-10 MG CAPS Take 1 capsule by mouth daily.    Historical Provider, MD  doxycycline (VIBRAMYCIN) 100 MG capsule Take 1 capsule (100 mg total) by mouth 2 (two) times daily. 11/01/15   Christopher Lawyer, PA-C  Guaifenesin 1200 MG TB12 Take 1 tablet (1,200 mg total) by mouth 2 (two) times daily. 11/01/15   Charlestine Nighthristopher Lawyer, PA-C  hydrALAZINE (APRESOLINE) 25 MG tablet Take 75 mg by mouth 4 (four) times daily.     Historical Provider, MD  HYDROcodone-acetaminophen (NORCO) 10-325 MG per tablet Take 0.5-1 tablets by mouth every 4 (four) hours as needed (for pain; may cause constipation). Patient not taking: Reported on 05/17/2014 11/04/13   Paula LibraJohn Molpus, MD  metFORMIN (  GLUCOPHAGE) 500 MG tablet Take 1,000 mg by mouth 2 (two) times daily with a meal.     Historical Provider, MD  metoprolol succinate (TOPROL-XL) 100 MG 24 hr tablet Take 100 mg by mouth daily.  10/06/10   Historical Provider, MD  promethazine (PHENERGAN) 25 MG tablet Take 1 tablet (25 mg total) by mouth every 6 (six) hours as needed for nausea or vomiting. Patient not taking: Reported on 05/17/2014 11/04/13   Paula Libra, MD  promethazine-dextromethorphan (PROMETHAZINE-DM) 6.25-15 MG/5ML syrup Take 5 mLs by mouth 4 (four) times daily as needed for cough. 11/01/15   Charlestine Night, PA-C     Family History Family History  Problem Relation Age of Onset  . Diabetes Mother   . Diabetes Father   . Hypertension Brother     Social History Social History  Substance Use Topics  . Smoking status: Never Smoker  . Smokeless tobacco: Never Used  . Alcohol use No     Allergies   Morphine and related   Review of Systems Review of Systems  All other systems reviewed and are negative.    Physical Exam Updated Vital Signs BP 164/86 (BP Location: Right Arm)   Pulse 78   Temp 98 F (36.7 C) (Oral)   Resp 18   Ht 5\' 4"  (1.626 m)   Wt 140 lb (63.5 kg)   SpO2 100%   BMI 24.03 kg/m   Physical Exam  Constitutional: She is oriented to person, place, and time. She appears well-developed and well-nourished. No distress.  HENT:  Head: Normocephalic and atraumatic.  Neck: Normal range of motion. Neck supple.  Musculoskeletal:  The right foot appears grossly normal with the exception of some swelling and erythema over the first MTP joint. There is significant pain with range of motion.  Neurological: She is alert and oriented to person, place, and time.  Skin: Skin is warm and dry. She is not diaphoretic.  Nursing note and vitals reviewed.    ED Treatments / Results  Labs (all labs ordered are listed, but only abnormal results are displayed) Labs Reviewed - No data to display  EKG  EKG Interpretation None       Radiology No results found.  Procedures Procedures (including critical care time)  Medications Ordered in ED Medications - No data to display   Initial Impression / Assessment and Plan / ED Course  I have reviewed the triage vital signs and the nursing notes.  Pertinent labs & imaging results that were available during my care of the patient were reviewed by me and considered in my medical decision making (see chart for details).  Clinical Course     I suspect this is a flareup of gout. She has never had this before. She will be treated  with indomethacin, pain medication, and return as needed.  Final Clinical Impressions(s) / ED Diagnoses   Final diagnoses:  None    New Prescriptions New Prescriptions   No medications on file     Geoffery Lyons, MD 06/03/16 [phone]

## 2016-06-03 NOTE — ED Notes (Signed)
ED Provider at bedside. 

## 2016-06-03 NOTE — ED Triage Notes (Signed)
Patient reports right foot pain which began yesterday evening.  Denies injury.

## 2016-06-03 NOTE — Discharge Instructions (Signed)
Indomethacin as prescribed.  Hydrocodone as prescribed as needed for pain.  Return to the emergency department if symptoms worsen, you develop high fevers, vomiting, or other new and concerning symptoms.

## 2017-03-08 ENCOUNTER — Emergency Department (HOSPITAL_BASED_OUTPATIENT_CLINIC_OR_DEPARTMENT_OTHER): Payer: Self-pay

## 2017-03-08 ENCOUNTER — Encounter (HOSPITAL_BASED_OUTPATIENT_CLINIC_OR_DEPARTMENT_OTHER): Payer: Self-pay

## 2017-03-08 ENCOUNTER — Emergency Department (HOSPITAL_BASED_OUTPATIENT_CLINIC_OR_DEPARTMENT_OTHER)
Admission: EM | Admit: 2017-03-08 | Discharge: 2017-03-08 | Disposition: A | Discharge status: Home / Self Care | Payer: Self-pay | Attending: Emergency Medicine | Admitting: Emergency Medicine

## 2017-03-08 DIAGNOSIS — I509 Heart failure, unspecified: Secondary | ICD-10-CM | POA: Insufficient documentation

## 2017-03-08 DIAGNOSIS — I110 Hypertensive heart disease with heart failure: Secondary | ICD-10-CM | POA: Insufficient documentation

## 2017-03-08 DIAGNOSIS — Z7984 Long term (current) use of oral hypoglycemic drugs: Secondary | ICD-10-CM | POA: Insufficient documentation

## 2017-03-08 DIAGNOSIS — E119 Type 2 diabetes mellitus without complications: Secondary | ICD-10-CM | POA: Insufficient documentation

## 2017-03-08 DIAGNOSIS — Z7982 Long term (current) use of aspirin: Secondary | ICD-10-CM | POA: Insufficient documentation

## 2017-03-08 DIAGNOSIS — Z79899 Other long term (current) drug therapy: Secondary | ICD-10-CM | POA: Insufficient documentation

## 2017-03-08 DIAGNOSIS — M1711 Unilateral primary osteoarthritis, right knee: Secondary | ICD-10-CM | POA: Insufficient documentation

## 2017-03-08 DIAGNOSIS — I10 Essential (primary) hypertension: Secondary | ICD-10-CM | POA: Insufficient documentation

## 2017-03-08 NOTE — ED Provider Notes (Signed)
[facility] Provider Note   CSN: [id number] Arrival date & time: 03/08/17  1938     History   Chief Complaint Chief Complaint  Patient presents with  . Knee Pain    HPI Daylen Hack is a 46 y.o. female.  HPI  46 year old female with a history of diabetes and stroke presents with right knee pain. She states her knee has hurt for "a while" but over the last couple weeks it has been worsening. Especially when walking she will have acute pain and/or will feel a pop and then her knee will buckle. No known trauma. No weakness or numbness in her extremity. She has not noticed joint swelling. Has not taken anything for the pain, states she's artery on to many medicines otherwise.  Past Medical History:  Diagnosis Date  . Diabetes mellitus   . Hypertension   . Stroke [facility])     Patient Active Problem List   Diagnosis Date Noted  . Nausea with vomiting 05/18/2014  . CHF (congestive heart failure) (HCC) 05/17/2014  . HLD (hyperlipidemia) 05/17/2014  . Elevated troponin 05/17/2014  . Pancreatitis, acute 05/17/2014  . Pancreatitis 05/17/2014  . Hypertension   . Stroke (HCC)   . Essential hypertension   . UTI (urinary tract infection), uncomplicated 03/20/2014  . Other and unspecified hyperlipidemia 03/19/2014  . Diabetes type 2, uncontrolled (HCC) 03/19/2014  . NICM (nonischemic cardiomyopathy) (HCC) 03/17/2014  . Accelerated hypertension 03/17/2014  . Sinus tachycardia 03/17/2014  . Demand ischemia (HCC) 03/17/2014  . 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 cyst removed    . TEE WITHOUT CARDIOVERSION N/A 03/18/2014   Procedure: TRANSESOPHAGEAL ECHOCARDIOGRAM (TEE);  Surgeon: Chrystie Nose, MD;  Location: [facility] ENDOSCOPY;  Service: Cardiovascular;  Laterality: N/A;  . TONSILLECTOMY      OB History    No data available       Home Medications    Prior to Admission medications     Medication Sig Start Date End Date Taking? Authorizing Provider  amLODipine (NORVASC) 10 MG tablet Take 10 mg by mouth daily.  04/24/14   [provider]  aspirin 325 MG tablet Take 1 tablet (325 mg total) by mouth daily. 03/20/14   Layne Benton, NP  atenolol (TENORMIN) 50 MG tablet Take 50 mg by mouth daily.    [provider]  atorvastatin (LIPITOR) 20 MG tablet Take 1 tablet (20 mg total) by mouth daily at 6 PM. 03/20/14   Layne Benton, NP  cefUROXime (CEFTIN) 250 MG tablet Take 2 tablets (500 mg total) by mouth 2 (two) times daily with a meal. 05/18/14   Jeralyn Bennett, MD  cloNIDine (CATAPRES) 0.2 MG tablet Take 0.2 mg by mouth 2 (two) times daily.    [provider]  Dextromethorphan-Quinidine 20-10 MG CAPS Take 1 capsule by mouth daily.    [provider]  doxycycline (VIBRAMYCIN) 100 MG capsule Take 1 capsule (100 mg total) by mouth 2 (two) times daily. 11/01/15   Lawyer, Cristal Deer, PA-C  glipiZIDE (GLUCOTROL) 10 MG tablet Take 10 mg by mouth daily before breakfast.    [provider]  Guaifenesin 1200 MG TB12 Take 1 tablet (1,200 mg total) by mouth 2 (two) times daily. 11/01/15   Lawyer, Cristal Deer, PA-C  hydrALAZINE (APRESOLINE) 25 MG tablet Take 75 mg by mouth 4 (four) times daily.     [provider]  HYDROcodone-acetaminophen [facility]) [phone]  MG tablet Take 1-2 tablets by mouth every 6 (six) hours as needed. 06/03/16   Geoffery Lyons, MD  indomethacin (INDOCIN) 25 MG capsule Take 1 capsule (25 mg total) by mouth 3 (three) times daily as needed. 06/03/16   Geoffery Lyons, MD  metFORMIN (GLUCOPHAGE) 500 MG tablet Take 1,000 mg by mouth 2 (two) times daily with a meal.     [provider]  metoprolol succinate (TOPROL-XL) 100 MG 24 hr tablet Take 100 mg by mouth daily.  10/06/10   [provider]  promethazine (PHENERGAN) 25 MG tablet Take 1 tablet (25 mg total) by mouth every 6 (six) hours as needed for nausea or  vomiting. Patient not taking: Reported on 05/17/2014 11/04/13   Molpus, John, MD  promethazine-dextromethorphan (PROMETHAZINE-DM) 6.25-15 MG/5ML syrup Take 5 mLs by mouth 4 (four) times daily as needed for cough. 11/01/15   Charlestine Night, PA-C    Family History Family History  Problem Relation Age of Onset  . Diabetes Mother   . Diabetes Father   . Hypertension Brother     Social History Social History  Substance Use Topics  . Smoking status: Never Smoker  . Smokeless tobacco: Never Used  . Alcohol use No     Allergies   Morphine and related   Review of Systems Review of Systems  Constitutional: Negative for fever.  Musculoskeletal: Positive for arthralgias. Negative for joint swelling.  Skin: Negative for color change.  Neurological: Negative for weakness and numbness.  All other systems reviewed and are negative.    Physical Exam Updated Vital Signs BP (!) 144/90 (BP Location: Right Arm)   Pulse 69   Temp 98.6 F (37 C) (Oral)   Resp 16   Ht 5\' 4"  (1.626 m)   Wt 68 kg (150 lb)   SpO2 100%   BMI 25.75 kg/m   Physical Exam  Constitutional: She is oriented to person, place, and time. She appears well-developed and well-nourished.  HENT:  Head: Normocephalic and atraumatic.  Right Ear: External ear normal.  Left Ear: External ear normal.  Nose: Nose normal.  Eyes: Right eye exhibits no discharge. Left eye exhibits no discharge.  Cardiovascular: Normal rate and regular rhythm.   Pulses:      Dorsalis pedis pulses are 2+ on the right side, and 2+ on the left side.  Pulmonary/Chest: Effort normal.  Musculoskeletal:       Right hip: She exhibits normal range of motion and no tenderness.       Right knee: She exhibits normal range of motion, no swelling, no effusion and no erythema. Tenderness found. Lateral joint line tenderness noted.       Right upper leg: She exhibits no tenderness.       Right lower leg: She exhibits no tenderness.  Neurological: She  is alert and oriented to person, place, and time.  Skin: Skin is warm and dry. No erythema.  Nursing note and vitals reviewed.    ED Treatments / Results  Labs (all labs ordered are listed, but only abnormal results are displayed) Labs Reviewed - No data to display  EKG  EKG Interpretation None       Radiology Dg Knee Complete 4 Views Right  Result Date: 03/08/2017 CLINICAL DATA:  46 y/o  F; right knee pain. EXAM: RIGHT KNEE - COMPLETE 4+ VIEW COMPARISON:  None. FINDINGS: No acute fracture, dislocation, or joint effusion. Joint spaces are well maintained. Mild tricompartmental osteoarthrosis with small periarticular osteophytes and spurring of the  tibial spines. Mild vascular calcifications. IMPRESSION: 1. No acute osseous abnormality identified. 2. Mild tricompartmental osteoarthrosis of the knee. Electronically Signed   By: Mitzi Hansen M.D.   On: 03/08/2017 22:20    Procedures Procedures (including critical care time)  Medications Ordered in ED Medications - No data to display   Initial Impression / Assessment and Plan / ED Course  I have reviewed the triage vital signs and the nursing notes.  Pertinent labs & imaging results that were available during my care of the patient were reviewed by me and considered in my medical decision making (see chart for details).     Patient is able to ambulate with a cane which is her normal. No focal joint swelling, has mild lateral knee tenderness. No effusion or erythema. She has good range of motion. This is likely from osteoarthritis as seen on the x-ray. Will refer to orthopedics/sports medicine. Discussed NSAIDs and Tylenol. Return precautions.  Final Clinical Impressions(s) / ED Diagnoses   Final diagnoses:  Arthritis of right knee    New Prescriptions Discharge Medication List as of 03/08/2017 11:06 PM       Pricilla Loveless, MD 03/08/17 2324

## 2017-03-08 NOTE — ED Notes (Signed)
Patient transported to X-ray 

## 2017-03-08 NOTE — ED Triage Notes (Signed)
Pt states right knee weakness/ pain . HX stroke

## 2017-03-08 NOTE — ED Notes (Signed)
ED Provider at bedside. 

## 2017-03-14 ENCOUNTER — Ambulatory Visit (INDEPENDENT_AMBULATORY_CARE_PROVIDER_SITE_OTHER): Payer: Self-pay | Admitting: Family Medicine

## 2017-03-14 ENCOUNTER — Encounter: Payer: Self-pay | Admitting: Family Medicine

## 2017-03-14 DIAGNOSIS — M25561 Pain in right knee: Secondary | ICD-10-CM

## 2017-03-14 NOTE — Patient Instructions (Signed)
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 Avoid painful activities when possible (often deep squats, lunges bother this). Straight leg raise, hip side raises, straight leg raises with foot turned outwards 3 sets of 10 once a day. Add ankle weight if these become too easy. Consider formal physical therapy. Correct foot breakdown with something like dr. Jari Sportsman active series, spencos, or our green sports insoles. Avoid flat shoes, barefoot walking as much as possible. Icing 15 minutes at a time 3-4 times a day as needed. Tylenol or ibuprofen as needed for pain. Follow up with me in 6 weeks.

## 2017-03-15 DIAGNOSIS — M25561 Pain in right knee: Secondary | ICD-10-CM | POA: Insufficient documentation

## 2017-03-15 NOTE — Assessment & Plan Note (Signed)
2/2 patellofemoral syndrome +/- arthritis.  Shown home exercises to do daily.  Icing regularly.  Tylenol or ibuprofen if needed for pain.  Discussed arch supports.  F/u in 6 weeks.  Consider physical therapy, injection, custom orthotics if not improving.

## 2017-03-15 NOTE — Progress Notes (Signed)
PCP: [facility]  Subjective:   HPI: Patient is a 46 y.o. female here for right knee pain.  Patient reports she's had about 2 weeks of anterior right knee pain. Pain level currently 0/10 but can get up to 10/10 and sharp. Bothers mainly with stairs, prolonged walking. Bracing helps. No swelling. Gets popping anterior right knee. No issues with left knee. No skin changes, numbness.  Past Medical History:  Diagnosis Date  . Diabetes mellitus   . Hypertension   . Stroke [facility])     Current Outpatient Prescriptions on File Prior to Visit  Medication Sig Dispense Refill  . amLODipine (NORVASC) 10 MG tablet Take 10 mg by mouth daily.   3  . aspirin 325 MG tablet Take 1 tablet (325 mg total) by mouth daily. 30 tablet 2  . atenolol (TENORMIN) 50 MG tablet Take 50 mg by mouth daily.    Marland Kitchen atorvastatin (LIPITOR) 20 MG tablet Take 1 tablet (20 mg total) by mouth daily at 6 PM. 30 tablet 2  . cefUROXime (CEFTIN) 250 MG tablet Take 2 tablets (500 mg total) by mouth 2 (two) times daily with a meal. 6 tablet 0  . cloNIDine (CATAPRES) 0.2 MG tablet Take 0.2 mg by mouth 2 (two) times daily.    Marland Kitchen Dextromethorphan-Quinidine 20-10 MG CAPS Take 1 capsule by mouth daily.    Marland Kitchen doxycycline (VIBRAMYCIN) 100 MG capsule Take 1 capsule (100 mg total) by mouth 2 (two) times daily. 20 capsule 0  . glipiZIDE (GLUCOTROL) 10 MG tablet Take 10 mg by mouth daily before breakfast.    . Guaifenesin 1200 MG TB12 Take 1 tablet (1,200 mg total) by mouth 2 (two) times daily. 20 each 0  . hydrALAZINE (APRESOLINE) 25 MG tablet Take 75 mg by mouth 4 (four) times daily.     Marland Kitchen HYDROcodone-acetaminophen (NORCO) 5-325 MG tablet Take 1-2 tablets by mouth every 6 (six) hours as needed. 20 tablet 0  . indomethacin (INDOCIN) 25 MG capsule Take 1 capsule (25 mg total) by mouth 3 (three) times daily as needed. 15 capsule 0  . metFORMIN (GLUCOPHAGE) 500 MG tablet Take 1,000 mg by mouth 2 (two) times daily  with a meal.     . metoprolol succinate (TOPROL-XL) 100 MG 24 hr tablet Take 100 mg by mouth daily.     . promethazine (PHENERGAN) 25 MG tablet Take 1 tablet (25 mg total) by mouth every 6 (six) hours as needed for nausea or vomiting. (Patient not taking: Reported on 05/17/2014) 20 tablet 0  . promethazine-dextromethorphan (PROMETHAZINE-DM) 6.25-15 MG/5ML syrup Take 5 mLs by mouth 4 (four) times daily as needed for cough. 120 mL 0   No current facility-administered medications on file prior to visit.     Past Surgical History:  Procedure Laterality Date  . cyst removed    . TEE WITHOUT CARDIOVERSION N/A 03/18/2014   Procedure: TRANSESOPHAGEAL ECHOCARDIOGRAM (TEE);  Surgeon: Chrystie Nose, MD;  Location: [facility] ENDOSCOPY;  Service: Cardiovascular;  Laterality: N/A;  . TONSILLECTOMY      Allergies  Allergen Reactions  . Morphine And Related Itching    Social History   Social History  . Marital status: Single    Spouse name: N/A  . Number of children: N/A  . Years of education: N/A   Occupational History  . Not on file.   Social History Main Topics  . Smoking status: Never Smoker  . Smokeless tobacco: Never Used  . Alcohol use No  .  Drug use: No  . Sexual activity: Not on file   Other Topics Concern  . Not on file   Social History Narrative  . No narrative on file    Family History  Problem Relation Age of Onset  . Diabetes Mother   . Diabetes Father   . Hypertension Brother     BP 122/71   Pulse 67   Ht  (1.626 m)   Wt 150 lb (68 kg)   BMI 25.75 kg/m   Review of Systems: See HPI above.     Objective:  Physical Exam:  Gen: NAD, comfortable in exam room  Right knee: No gross deformity, ecchymoses, effusion.  VMO atrophy. TTP post patellar facets.  Minimal tenderness medial joint line.  No other tenderness. FROM. Negative ant/post drawers. Negative valgus/varus testing. Negative lachmanns. Negative mcmurrays, apleys, patellar apprehension. NV  intact distally.  Left knee: FROM without pain.   Assessment & Plan:  1. Right knee pain - 2/2 patellofemoral syndrome +/- arthritis.  Shown home exercises to do daily.  Icing regularly.  Tylenol or ibuprofen if needed for pain.  Discussed arch supports.  F/u in 6 weeks.  Consider physical therapy, injection, custom orthotics if not improving.

## 2017-04-25 ENCOUNTER — Ambulatory Visit: Payer: Self-pay | Admitting: Family Medicine

## 2017-06-27 ENCOUNTER — Encounter (HOSPITAL_BASED_OUTPATIENT_CLINIC_OR_DEPARTMENT_OTHER): Payer: Self-pay | Admitting: *Deleted

## 2017-06-27 ENCOUNTER — Other Ambulatory Visit: Payer: Self-pay

## 2017-06-27 ENCOUNTER — Emergency Department (HOSPITAL_BASED_OUTPATIENT_CLINIC_OR_DEPARTMENT_OTHER)
Admission: EM | Admit: 2017-06-27 | Discharge: 2017-06-27 | Disposition: A | Discharge status: Home / Self Care | Payer: Self-pay | Attending: Emergency Medicine | Admitting: Emergency Medicine

## 2017-06-27 DIAGNOSIS — L0291 Cutaneous abscess, unspecified: Secondary | ICD-10-CM

## 2017-06-27 DIAGNOSIS — I509 Heart failure, unspecified: Secondary | ICD-10-CM | POA: Insufficient documentation

## 2017-06-27 DIAGNOSIS — E119 Type 2 diabetes mellitus without complications: Secondary | ICD-10-CM | POA: Insufficient documentation

## 2017-06-27 DIAGNOSIS — I110 Hypertensive heart disease with heart failure: Secondary | ICD-10-CM | POA: Insufficient documentation

## 2017-06-27 DIAGNOSIS — E785 Hyperlipidemia, unspecified: Secondary | ICD-10-CM | POA: Insufficient documentation

## 2017-06-27 DIAGNOSIS — I10 Essential (primary) hypertension: Secondary | ICD-10-CM | POA: Insufficient documentation

## 2017-06-27 DIAGNOSIS — Z8673 Personal history of transient ischemic attack (TIA), and cerebral infarction without residual deficits: Secondary | ICD-10-CM | POA: Insufficient documentation

## 2017-06-27 DIAGNOSIS — L0211 Cutaneous abscess of neck: Secondary | ICD-10-CM | POA: Insufficient documentation

## 2017-06-27 DIAGNOSIS — Z7984 Long term (current) use of oral hypoglycemic drugs: Secondary | ICD-10-CM | POA: Insufficient documentation

## 2017-06-27 DIAGNOSIS — Z79899 Other long term (current) drug therapy: Secondary | ICD-10-CM | POA: Insufficient documentation

## 2017-06-27 DIAGNOSIS — Z7982 Long term (current) use of aspirin: Secondary | ICD-10-CM | POA: Insufficient documentation

## 2017-06-27 MED ORDER — LIDOCAINE-EPINEPHRINE (PF) 2 %-1:200000 IJ SOLN
INTRAMUSCULAR | Status: AC
  Filled 2017-06-27: qty 10

## 2017-06-27 MED ORDER — SULFAMETHOXAZOLE-TRIMETHOPRIM 800-160 MG PO TABS: 1.0000 | ORAL_TABLET | Freq: Two times a day (BID) | ORAL | 0 refills | Status: AC

## 2017-06-27 NOTE — ED Triage Notes (Signed)
Abscess to the back of her neck for a week.  

## 2017-06-27 NOTE — ED Provider Notes (Signed)
[facility] EMERGENCY DEPARTMENT Provider Note   CSN: [id number] Arrival date & time: 06/27/17  1321     History   Chief Complaint Chief Complaint  Patient presents with  . Abscess    HPI Sharon May is a 47 y.o. female.  HPI Here for evaluation of posterior neck abscess.  Patient reports this is happened her in the past.  Reports over the past 1 week it has become more swollen, tender.  Nothing tried to improve her symptoms.  Denies any fevers, chills, headache, other neck pain, numbness or weakness. Past Medical History:  Diagnosis Date  . Diabetes mellitus   . Hypertension   . Stroke [facility])     Patient Active Problem List   Diagnosis Date Noted  . Right knee pain 03/15/2017  . Nausea with vomiting 05/18/2014  . CHF (congestive heart failure) (HCC) 05/17/2014  . HLD (hyperlipidemia) 05/17/2014  . Elevated troponin 05/17/2014  . Pancreatitis, acute 05/17/2014  . Pancreatitis 05/17/2014  . Hypertension   . Stroke (HCC)   . Essential hypertension   . UTI (urinary tract infection), uncomplicated 03/20/2014  . Other and unspecified hyperlipidemia 03/19/2014  . Diabetes type 2, uncontrolled (HCC) 03/19/2014  . NICM (nonischemic cardiomyopathy) (HCC) 03/17/2014  . Accelerated hypertension 03/17/2014  . Sinus tachycardia 03/17/2014  . Demand ischemia (HCC) 03/17/2014  . 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 cyst removed    . TEE WITHOUT CARDIOVERSION N/A 03/18/2014   Procedure: TRANSESOPHAGEAL ECHOCARDIOGRAM (TEE);  Surgeon: Chrystie Nose, MD;  Location: [facility] ENDOSCOPY;  Service: Cardiovascular;  Laterality: N/A;  . TONSILLECTOMY      OB History    No data available       Home Medications    Prior to Admission medications   Medication Sig Start Date End Date Taking? Authorizing Provider  amLODipine (NORVASC) 10 MG tablet Take 10 mg by mouth daily.  04/24/14   [provider]  aspirin 325 MG tablet Take 1 tablet (325 mg total) by mouth daily. 03/20/14   Layne Benton, NP  atenolol (TENORMIN) 50 MG tablet Take 50 mg by mouth daily.    [provider]  atorvastatin (LIPITOR) 20 MG tablet Take 1 tablet (20 mg total) by mouth daily at 6 PM. 03/20/14   Layne Benton, NP  cefUROXime (CEFTIN) 250 MG tablet Take 2 tablets (500 mg total) by mouth 2 (two) times daily with a meal. 05/18/14   Jeralyn Bennett, MD  cloNIDine (CATAPRES) 0.2 MG tablet Take 0.2 mg by mouth 2 (two) times daily.    [provider]  Dextromethorphan-Quinidine 20-10 MG CAPS Take 1 capsule by mouth daily.    [provider]  doxycycline (VIBRAMYCIN) 100 MG capsule Take 1 capsule (100 mg total) by mouth 2 (two) times daily. 11/01/15   Lawyer, Cristal Deer, PA-C  glipiZIDE (GLUCOTROL) 10 MG tablet Take 10 mg by mouth daily before breakfast.    [provider]  Guaifenesin 1200 MG TB12 Take 1 tablet (1,200 mg total) by mouth 2 (two) times daily. 11/01/15   Lawyer, Cristal Deer, PA-C  hydrALAZINE (APRESOLINE) 25 MG tablet Take 75 mg by mouth 4 (four) times daily.     [provider]  HYDROcodone-acetaminophen (NORCO) 5-325 MG tablet Take 1-2 tablets by mouth every 6 (six) hours as needed. 06/03/16   Geoffery Lyons, MD  indomethacin (INDOCIN) 25 MG capsule Take 1 capsule (25 mg  total) by mouth 3 (three) times daily as needed. 06/03/16   Geoffery Lyons, MD  metFORMIN (GLUCOPHAGE) 500 MG tablet Take 1,000 mg by mouth 2 (two) times daily with a meal.     [provider]  metoprolol succinate (TOPROL-XL) 100 MG 24 hr tablet Take 100 mg by mouth daily.  10/06/10   [provider]  promethazine (PHENERGAN) 25 MG tablet Take 1 tablet (25 mg total) by mouth every 6 (six) hours as needed for nausea or vomiting. Patient not taking: Reported on 05/17/2014 11/04/13   Molpus, John, MD  promethazine-dextromethorphan (PROMETHAZINE-DM) 6.25-15 MG/5ML syrup Take 5  mLs by mouth 4 (four) times daily as needed for cough. 11/01/15   Lawyer, Cristal Deer, PA-C  sulfamethoxazole-trimethoprim (BACTRIM DS,SEPTRA DS) 800-160 MG tablet Take 1 tablet by mouth 2 (two) times daily for 7 days. 06/27/17 07/04/17  Joycie Peek, PA-C    Family History Family History  Problem Relation Age of Onset  . Diabetes Mother   . Diabetes Father   . Hypertension Brother     Social History Social History   Tobacco Use  . Smoking status: Never Smoker  . Smokeless tobacco: Never Used  Substance Use Topics  . Alcohol use: No  . Drug use: No     Allergies   Morphine and related   Review of Systems Review of Systems See HPI  Physical Exam Updated Vital Signs BP 134/77   Pulse 72   Temp 98.1 F (36.7 C) (Oral)   Resp 18   Ht 5\' 4"  (1.626 m)   Wt 68 kg (150 lb)   SpO2 100%   BMI 25.75 kg/m   Physical Exam  Constitutional: She appears well-developed. No distress.  Awake, alert and nontoxic in appearance  HENT:  Head: Normocephalic and atraumatic.  Right Ear: External ear normal.  Left Ear: External ear normal.  Mouth/Throat: Oropharynx is clear and moist.  Eyes: Conjunctivae and EOM are normal. Pupils are equal, round, and reactive to light.  Neck: Normal range of motion. No JVD present.  Midline abscess in posterior neck, roughly 0.75 inches in diameter, fluctuant, surrounding redness and tenderness.  Nonpulsatile  Cardiovascular: Normal rate, regular rhythm and normal heart sounds.  Pulmonary/Chest: Effort normal and breath sounds normal. No stridor.  Abdominal: Soft. There is no tenderness.  Musculoskeletal: Normal range of motion.  Neurological:  Awake, alert, cooperative and aware of situation; motor strength bilaterally; sensation normal to light touch bilaterally; no facial asymmetry; tongue midline; major cranial nerves appear intact;  baseline gait without new ataxia.  Skin: No rash noted. She is not diaphoretic.  Psychiatric: She has a normal  mood and affect. Her behavior is normal. Thought content normal.  Nursing note and vitals reviewed.    ED Treatments / Results  Labs (all labs ordered are listed, but only abnormal results are displayed) Labs Reviewed - No data to display  EKG  EKG Interpretation None       Radiology No results found.  Procedures .Marland KitchenIncision and Drainage Date/Time: 06/27/2017 4:37 PM Performed by: Joycie Peek, PA-C Authorized by: Joycie Peek, PA-C   Consent:    Consent obtained:  Verbal Location:    Type:  Abscess   Location:  Neck Pre-procedure details:    Skin preparation:  Betadine Anesthesia (see MAR for exact dosages):    Anesthesia method:  Local infiltration   Local anesthetic:  Lidocaine 2% WITH epi Procedure details:    Incision types:  Single straight   Scalpel blade:  11  Wound management:  Probed and deloculated   Drainage:  Bloody and purulent   Drainage amount:  Moderate   Wound treatment:  Wound left open   Packing materials:  None Post-procedure details:    Patient tolerance of procedure:  Tolerated well, no immediate complications   (including critical care time)  Medications Ordered in ED Medications  lidocaine-EPINEPHrine (XYLOCAINE W/EPI) 2 %-1:200000 (PF) injection (not administered)     Initial Impression / Assessment and Plan / ED Course  I have reviewed the triage vital signs and the nursing notes.  Pertinent labs & imaging results that were available during my care of the patient were reviewed by me and considered in my medical decision making (see chart for details).      I&D performed at bedside without apparent complication.  Patient is a diabetic, will cover with Bactrim.  Discussed continued supportive care at home, wound hygiene.  Follow-up with PCP for wound check next week.  Return to ED for any new or worsening symptoms. Final Clinical Impressions(s) / ED Diagnoses   Final diagnoses:  Abscess    ED Discharge Orders         Ordered    sulfamethoxazole-trimethoprim (BACTRIM DS,SEPTRA DS) 800-160 MG tablet  2 times daily     06/27/17 1632       Joycie Peek, PA-C 06/27/17 1639    Pricilla Loveless, MD 06/30/17 1031

## 2017-06-27 NOTE — Discharge Instructions (Signed)
Remember to keep your wound clean and dry, it will continue to drain over the next couple of days which is normal.  Take your antibiotics as prescribed to help heal the surrounding infection.  Follow-up with your doctor.  Return to ED for any new or worsening symptoms

## 2017-07-16 ENCOUNTER — Emergency Department (HOSPITAL_BASED_OUTPATIENT_CLINIC_OR_DEPARTMENT_OTHER): Payer: Self-pay

## 2017-07-16 ENCOUNTER — Encounter (HOSPITAL_BASED_OUTPATIENT_CLINIC_OR_DEPARTMENT_OTHER): Payer: Self-pay | Admitting: Emergency Medicine

## 2017-07-16 ENCOUNTER — Emergency Department (HOSPITAL_COMMUNITY): Payer: Self-pay

## 2017-07-16 ENCOUNTER — Emergency Department (HOSPITAL_BASED_OUTPATIENT_CLINIC_OR_DEPARTMENT_OTHER)
Admission: EM | Admit: 2017-07-16 | Discharge: 2017-07-17 | Disposition: A | Discharge status: Home / Self Care | Payer: Self-pay | Attending: Emergency Medicine | Admitting: Emergency Medicine

## 2017-07-16 ENCOUNTER — Other Ambulatory Visit: Payer: Self-pay

## 2017-07-16 DIAGNOSIS — Z8673 Personal history of transient ischemic attack (TIA), and cerebral infarction without residual deficits: Secondary | ICD-10-CM | POA: Insufficient documentation

## 2017-07-16 DIAGNOSIS — I509 Heart failure, unspecified: Secondary | ICD-10-CM | POA: Insufficient documentation

## 2017-07-16 DIAGNOSIS — Z7984 Long term (current) use of oral hypoglycemic drugs: Secondary | ICD-10-CM | POA: Insufficient documentation

## 2017-07-16 DIAGNOSIS — Z79899 Other long term (current) drug therapy: Secondary | ICD-10-CM | POA: Insufficient documentation

## 2017-07-16 DIAGNOSIS — I110 Hypertensive heart disease with heart failure: Secondary | ICD-10-CM | POA: Insufficient documentation

## 2017-07-16 DIAGNOSIS — Z7982 Long term (current) use of aspirin: Secondary | ICD-10-CM | POA: Insufficient documentation

## 2017-07-16 DIAGNOSIS — E119 Type 2 diabetes mellitus without complications: Secondary | ICD-10-CM | POA: Insufficient documentation

## 2017-07-16 DIAGNOSIS — R29898 Other symptoms and signs involving the musculoskeletal system: Secondary | ICD-10-CM

## 2017-07-16 DIAGNOSIS — R748 Abnormal levels of other serum enzymes: Secondary | ICD-10-CM | POA: Insufficient documentation

## 2017-07-16 DIAGNOSIS — R778 Other specified abnormalities of plasma proteins: Secondary | ICD-10-CM

## 2017-07-16 DIAGNOSIS — E876 Hypokalemia: Secondary | ICD-10-CM | POA: Insufficient documentation

## 2017-07-16 DIAGNOSIS — M6281 Muscle weakness (generalized): Secondary | ICD-10-CM | POA: Insufficient documentation

## 2017-07-16 DIAGNOSIS — R29818 Other symptoms and signs involving the nervous system: Secondary | ICD-10-CM | POA: Insufficient documentation

## 2017-07-16 DIAGNOSIS — R7989 Other specified abnormal findings of blood chemistry: Secondary | ICD-10-CM

## 2017-07-16 LAB — DIFFERENTIAL
BASOS ABS: 0 10*3/uL (ref 0.0–0.1)
BASOS PCT: 0 %
EOS ABS: 0.2 10*3/uL (ref 0.0–0.7)
EOS PCT: 2 %
Lymphocytes Relative: 31 %
Lymphs Abs: 2.9 10*3/uL (ref 0.7–4.0)
Monocytes Absolute: 0.7 10*3/uL (ref 0.1–1.0)
Monocytes Relative: 7 %
NEUTROS PCT: 60 %
Neutro Abs: 5.5 10*3/uL (ref 1.7–7.7)

## 2017-07-16 LAB — PROTIME-INR
INR: 0.94
PROTHROMBIN TIME: 12.5 s (ref 11.4–15.2)

## 2017-07-16 LAB — ETHANOL

## 2017-07-16 LAB — URINALYSIS, ROUTINE W REFLEX MICROSCOPIC
BILIRUBIN URINE: NEGATIVE
GLUCOSE, UA: NEGATIVE mg/dL
HGB URINE DIPSTICK: NEGATIVE
Ketones, ur: NEGATIVE mg/dL
Nitrite: NEGATIVE
Protein, ur: NEGATIVE mg/dL
SPECIFIC GRAVITY, URINE: 1.01 (ref 1.005–1.030)
pH: 6 (ref 5.0–8.0)

## 2017-07-16 LAB — RAPID URINE DRUG SCREEN, HOSP PERFORMED
AMPHETAMINES: NOT DETECTED
BENZODIAZEPINES: NOT DETECTED
Barbiturates: NOT DETECTED
COCAINE: NOT DETECTED
Opiates: NOT DETECTED
Tetrahydrocannabinol: NOT DETECTED

## 2017-07-16 LAB — COMPREHENSIVE METABOLIC PANEL
ALBUMIN: 4.3 g/dL (ref 3.5–5.0)
ALT: 19 U/L (ref 14–54)
ANION GAP: 13 (ref 5–15)
AST: 25 U/L (ref 15–41)
Alkaline Phosphatase: 81 U/L (ref 38–126)
BUN: 37 mg/dL — AB (ref 6–20)
CHLORIDE: 101 mmol/L (ref 101–111)
CO2: 23 mmol/L (ref 22–32)
Calcium: 9.1 mg/dL (ref 8.9–10.3)
Creatinine, Ser: 1.91 mg/dL — ABNORMAL HIGH (ref 0.44–1.00)
GFR calc Af Amer: 35 mL/min — ABNORMAL LOW (ref >60)
GFR, EST NON AFRICAN AMERICAN: 30 mL/min — AB (ref >60)
GLUCOSE: 181 mg/dL — AB (ref 65–99)
POTASSIUM: 2.8 mmol/L — AB (ref 3.5–5.1)
Sodium: 137 mmol/L (ref 135–145)
Total Bilirubin: 0.5 mg/dL (ref 0.3–1.2)
Total Protein: 8.6 g/dL — ABNORMAL HIGH (ref 6.5–8.1)

## 2017-07-16 LAB — CBC
HCT: 34.2 % — ABNORMAL LOW (ref 36.0–46.0)
Hemoglobin: 12.4 g/dL (ref 12.0–15.0)
MCH: 29.1 pg (ref 26.0–34.0)
MCHC: 36.3 g/dL — AB (ref 30.0–36.0)
MCV: 80.3 fL (ref 78.0–100.0)
PLATELETS: 318 10*3/uL (ref 150–400)
RBC: 4.26 MIL/uL (ref 3.87–5.11)
RDW: 12.1 % (ref 11.5–15.5)
WBC: 9.3 10*3/uL (ref 4.0–10.5)

## 2017-07-16 LAB — PREGNANCY, URINE: PREG TEST UR: NEGATIVE

## 2017-07-16 LAB — URINALYSIS, MICROSCOPIC (REFLEX): RBC / HPF: NONE SEEN RBC/hpf (ref 0–5)

## 2017-07-16 LAB — CBG MONITORING, ED: Glucose-Capillary: 268 mg/dL — ABNORMAL HIGH (ref 65–99)

## 2017-07-16 LAB — TROPONIN I
TROPONIN I: 0.16 ng/mL — AB (ref <0.03)
Troponin I: 0.16 ng/mL (ref <0.03)

## 2017-07-16 LAB — APTT: aPTT: 30 seconds (ref 24–36)

## 2017-07-16 MED ORDER — POTASSIUM CHLORIDE 10 MEQ/100ML IV SOLN
10.0000 meq | Freq: Once | INTRAVENOUS | Status: DC
  Filled 2017-07-16: qty 100

## 2017-07-16 MED ORDER — POTASSIUM CHLORIDE CRYS ER 20 MEQ PO TBCR
40.0000 meq | EXTENDED_RELEASE_TABLET | Freq: Once | ORAL | Status: AC
  Administered 2017-07-17: 40 meq via ORAL
  Filled 2017-07-16: qty 2

## 2017-07-16 NOTE — ED Notes (Addendum)
Alert, NAD, calm, interactive, resps e/u, speaking in clear complete sentences, no dyspnea noted, skin W&D, (denies: pain, sob, nausea, dizziness or blurry vision), VSS, tolerated IV stick well, blood obtained, carelink here for transport, pt denies questions or needs. Family at [facility].

## 2017-07-16 NOTE — ED Notes (Signed)
Pt refused to get into hospital gown.

## 2017-07-16 NOTE — ED Notes (Signed)
Patient transported to MRI 

## 2017-07-16 NOTE — ED Notes (Signed)
Dr Fredderick Phenix aware that's pt's troponin is elevated. Pt has transferred to [facility] ED. Will call and Public librarian at [facility]

## 2017-07-16 NOTE — ED Notes (Signed)
Pt complained of right arm heaviness that started yesterday.

## 2017-07-16 NOTE — ED Triage Notes (Signed)
Pt reports R arm heaviness since yesterday. She can't remember what time it started. She states it comes and goes. Pt with equal grips, clear speech, denies weakness to arms and legs.

## 2017-07-16 NOTE — ED Provider Notes (Signed)
  Physical Exam  BP (!) 150/85 (BP Location: Right Arm)   Pulse 66   Temp 99.3 F (37.4 C)   Resp 16   Ht 5\' 4"  (1.626 m)   Wt 68 kg (150 lb)   SpO2 100%   BMI 25.75 kg/m   Physical Exam  Constitutional: She is oriented to person, place, and time. She appears well-developed and well-nourished. No distress.  HENT:  Head: Normocephalic.  Neck: Normal range of motion. Neck supple.  Cardiovascular: Normal rate, regular rhythm and intact distal pulses.  Pulmonary/Chest: Effort normal and breath sounds normal. She has no wheezes.  Abdominal: Soft. Bowel sounds are normal. There is no tenderness. There is no rebound and no guarding.  Musculoskeletal: Normal range of motion.  Neurological: She is alert and oriented to person, place, and time.  Minimal right grip weakness. Normal sensation bilaterally. CN's 3-12 grossly intact. No facial asymmetry. Normal speech. No deficits of coordination.  Skin: Skin is warm and dry. No rash noted.  Psychiatric: She has a normal mood and affect.    ED Course/Procedures     Procedures  MDM  Patient transferred from [facility] for further evaluation by MRI for recurrent stroke. Study pending.   She is found to have an elevated troponin. She denies chest pain, SOB. Chart reviewed. She has had elevated troponins in the past without diagnosis of MI. History of CHF. EKG shows no acute ischemic changes tonight. Repeat troponin pending.  MR negative for acute finding. The patient's potassium is low at 2.8. She reports similar problem in the past, corrected with diet. No neurologic changes on re-exam.   Troponin remains elevated to 0.16 - no change from initial study. She continues to deny chest pain or SOB.   Recommended admission for observation for potassium correction and trending troponins. Patient adamant about not being admitted to the hospital. She states she has to get home and will not be admitted.   She can be discharged. Strongly encouraged to see  her doctor this week for repeat lab studies. Strict return precautions of any chest pain, SOB, vomiting, discussed and patient acknowledges understanding.   Diagnoses 1. Right arm heaviness 2. Hypokalemia 3. Elevated troponins        Elpidio Anis, PA-C 07/17/17 1779    Arby Barrette, MD 07/20/17 1339

## 2017-07-16 NOTE — ED Notes (Signed)
Heaviness in her rt arm that comes and goes x 2 days has hx of stroke no slurred speech no weakness

## 2017-07-16 NOTE — ED Provider Notes (Addendum)
[facility] EMERGENCY DEPARTMENT Provider Note   CSN: [id number] Arrival date & time: 07/16/17  1527     History   Chief Complaint Chief Complaint  Patient presents with  . Arm heaviness    HPI Sharon May is a 47 y.o. female.  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  At that point she had presented with right arm weakness and left for gaze deviation.  She had presented to med [facility] and transferred to [facility] where she received TPA.  Today she presents with some right arm heaviness that is been going on for 3 days.  She states that feels heavier than the other side.  She denies any.  No leg involvement.  No vision changes.  She has some chronic loss of her right peripheral vision from her prior stroke but no change in this.  She denies any speech deficits.  No balance problems.  No recent head injuries.  She denies any injury to the arm.      Past Medical History:  Diagnosis Date  . Diabetes mellitus   . Hypertension   . Stroke [facility])     Patient Active Problem List   Diagnosis Date Noted  . Right knee pain 03/15/2017  . Nausea with vomiting 05/18/2014  . CHF (congestive heart failure) (HCC) 05/17/2014  . HLD (hyperlipidemia) 05/17/2014  . Elevated troponin 05/17/2014  . Pancreatitis, acute 05/17/2014  . Pancreatitis 05/17/2014  . Hypertension   . Stroke (HCC)   . Essential hypertension   . UTI (urinary tract infection), uncomplicated 03/20/2014  . Other and unspecified hyperlipidemia 03/19/2014  . Diabetes type 2, uncontrolled (HCC) 03/19/2014  . NICM (nonischemic cardiomyopathy) (HCC) 03/17/2014  . Accelerated hypertension 03/17/2014  . Sinus tachycardia 03/17/2014  . Demand ischemia (HCC) 03/17/2014  . 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 cyst removed    . TEE  WITHOUT CARDIOVERSION N/A 03/18/2014   Procedure: TRANSESOPHAGEAL ECHOCARDIOGRAM (TEE);  Surgeon: Chrystie Nose, MD;  Location: [facility] ENDOSCOPY;  Service: Cardiovascular;  Laterality: N/A;  . TONSILLECTOMY      OB History    No data available       Home Medications    Prior to Admission medications   Medication Sig Start Date End Date Taking? Authorizing Provider  amLODipine (NORVASC) 10 MG tablet Take 10 mg by mouth daily.  04/24/14   [provider]  aspirin 325 MG tablet Take 1 tablet (325 mg total) by mouth daily. 03/20/14   Layne Benton, NP  atenolol (TENORMIN) 50 MG tablet Take 50 mg by mouth daily.    [provider]  atorvastatin (LIPITOR) 20 MG tablet Take 1 tablet (20 mg total) by mouth daily at 6 PM. 03/20/14   Layne Benton, NP  cefUROXime (CEFTIN) 250 MG tablet Take 2 tablets (500 mg total) by mouth 2 (two) times daily with a meal. 05/18/14   Jeralyn Bennett, MD  cloNIDine (CATAPRES) 0.2 MG tablet Take 0.2 mg by mouth 2 (two) times daily.    [provider]  Dextromethorphan-Quinidine 20-10 MG CAPS Take 1 capsule by mouth daily.    [provider]  doxycycline (VIBRAMYCIN) 100 MG capsule Take 1 capsule (100 mg total) by mouth 2 (two) times daily. 11/01/15   Lawyer, Cristal Deer, PA-C  glipiZIDE (GLUCOTROL) 10 MG  tablet Take 10 mg by mouth daily before breakfast.    [provider]  Guaifenesin 1200 MG TB12 Take 1 tablet (1,200 mg total) by mouth 2 (two) times daily. 11/01/15   Lawyer, Cristal Deer, PA-C  hydrALAZINE (APRESOLINE) 25 MG tablet Take 75 mg by mouth 4 (four) times daily.     [provider]  HYDROcodone-acetaminophen (NORCO) 5-325 MG tablet Take 1-2 tablets by mouth every 6 (six) hours as needed. 06/03/16   Geoffery Lyons, MD  indomethacin (INDOCIN) 25 MG capsule Take 1 capsule (25 mg total) by mouth 3 (three) times daily as needed. 06/03/16   Geoffery Lyons, MD  metFORMIN (GLUCOPHAGE) 500 MG tablet Take 1,000 mg by  mouth 2 (two) times daily with a meal.     [provider]  metoprolol succinate (TOPROL-XL) 100 MG 24 hr tablet Take 100 mg by mouth daily.  10/06/10   [provider]  promethazine (PHENERGAN) 25 MG tablet Take 1 tablet (25 mg total) by mouth every 6 (six) hours as needed for nausea or vomiting. Patient not taking: Reported on 05/17/2014 11/04/13   Molpus, John, MD  promethazine-dextromethorphan (PROMETHAZINE-DM) 6.25-15 MG/5ML syrup Take 5 mLs by mouth 4 (four) times daily as needed for cough. 11/01/15   Charlestine Night, PA-C    Family History Family History  Problem Relation Age of Onset  . Diabetes Mother   . Diabetes Father   . Hypertension Brother     Social History Social History   Tobacco Use  . Smoking status: Never Smoker  . Smokeless tobacco: Never Used  Substance Use Topics  . Alcohol use: No  . Drug use: No     Allergies   Morphine and related   Review of Systems Review of Systems  Constitutional: Negative for chills, diaphoresis, fatigue and fever.  HENT: Negative for congestion, rhinorrhea and sneezing.   Eyes: Negative.   Respiratory: Negative for cough, chest tightness and shortness of breath.   Cardiovascular: Negative for chest pain and leg swelling.  Gastrointestinal: Negative for abdominal pain, blood in stool, diarrhea, nausea and vomiting.  Genitourinary: Negative for difficulty urinating, flank pain, frequency and hematuria.  Musculoskeletal: Negative for arthralgias and back pain.  Skin: Negative for rash.  Neurological: Positive for weakness. Negative for dizziness, speech difficulty, numbness and headaches.     Physical Exam Updated Vital Signs BP (!) 132/92 (BP Location: Right Arm)   Pulse 72   Temp 99.3 F (37.4 C) (Oral)   Resp 16   Ht 5\' 4"  (1.626 m)   Wt 68 kg (150 lb)   SpO2 100%   BMI 25.75 kg/m   Physical Exam  Constitutional: She is oriented to person, place, and time. She appears well-developed and  well-nourished.  HENT:  Head: Normocephalic and atraumatic.  Eyes: Pupils are equal, round, and reactive to light.  Neck: Normal range of motion. Neck supple.  Cardiovascular: Normal rate, regular rhythm and normal heart sounds.  Pulmonary/Chest: Effort normal and breath sounds normal. No respiratory distress. She has no wheezes. She has no rales. She exhibits no tenderness.  Abdominal: Soft. Bowel sounds are normal. There is no tenderness. There is no rebound and no guarding.  Musculoskeletal: Normal range of motion. She exhibits no edema.  Lymphadenopathy:    She has no cervical adenopathy.  Neurological: She is alert and oriented to person, place, and time.  Patient has some subtle weakness of the right arm as compared to the left.  She has no sensory deficit.  She  has normal strength and sensation in the lower extremities.  She has normal gait.  She has some loss of her right lateral vision she has chronic for her.  No cranial nerve abnormalities are detected.  Finger-nose intact, no pronator drift  Skin: Skin is warm and dry. No rash noted.  Psychiatric: She has a normal mood and affect.     ED Treatments / Results  Labs (all labs ordered are listed, but only abnormal results are displayed) Labs Reviewed  URINALYSIS, ROUTINE W REFLEX MICROSCOPIC - Abnormal; Notable for the following components:      Result Value   Color, Urine STRAW (*)    APPearance CLOUDY (*)    Leukocytes, UA LARGE (*)    All other components within normal limits  URINALYSIS, MICROSCOPIC (REFLEX) - Abnormal; Notable for the following components:   Bacteria, UA MANY (*)    Squamous Epithelial / LPF 6-30 (*)    All other components within normal limits  CBG MONITORING, ED - Abnormal; Notable for the following components:   Glucose-Capillary 268 (*)    All other components within normal limits  RAPID URINE DRUG SCREEN, HOSP PERFORMED  PREGNANCY, URINE  ETHANOL  PROTIME-INR  APTT  CBC  DIFFERENTIAL    COMPREHENSIVE METABOLIC PANEL  TROPONIN I    EKG  EKG Interpretation None       Radiology Ct Head Wo Contrast  Result Date: 07/16/2017 CLINICAL DATA:  Focal neurologic deficit.  History of stroke EXAM: CT HEAD WITHOUT CONTRAST TECHNIQUE: Contiguous axial images were obtained from the base of the skull through the vertex without intravenous contrast. COMPARISON:  MRI 03/06/2015, CT 03/06/2015 FINDINGS: Brain: Chronic left posterior cerebral artery infarct is unchanged from the prior study involving the left occipital lobe and left thalamus. Negative for acute infarct. Mild chronic ischemic change in the white matter. Negative for hemorrhage or mass. Ventricle size normal. Vascular: Negative for hyperdense vessel Skull: Negative Sinuses/Orbits: Negative Other: None IMPRESSION: Chronic left PCA infarct unchanged.  No acute abnormality. Electronically Signed   By: Marlan Palau M.D.   On: 07/16/2017 17:56    Procedures Procedures (including critical care time)  Medications Ordered in ED Medications - No data to display   Initial Impression / Assessment and Plan / ED Course  I have reviewed the triage vital signs and the nursing notes.  Pertinent labs & imaging results that were available during my care of the patient were reviewed by me and considered in my medical decision making (see chart for details).     Patient is a 47 year old female who presents with some right arm weakness.  Her symptoms are subtle however she initially presented with similar symptoms 3 years ago when she had a stroke that resulted in TPA administration.  I feel that her symptoms warrant an MRI.  We are unable to obtain out at this facility.  I discussed the option of transferring her to [facility] which she is amenable to.  Her initial head CT is negative.  I spoke with Dr. Jodi Mourning who has accepted the patient to [facility] ED.   21:00 as patient was in route to [facility] ED, some of her labs resulted  including her troponin which did come back positive.  At the time of my evaluation she did not have any symptoms that sounded cardiac other than the right arm heaviness.  She did not report any chest pain or shortness of breath.  The symptoms have been going on for 3  days.  Her EKG did she have some ST elevation anteriorly but it appeared to be unchanged from her prior EKG.  However given her elevated troponin, I did notify Dr. Clarice Pole that they may need to repeat an EKG and follow the troponin to make sure that her symptoms are not from a cardiac etiology. Final Clinical Impressions(s) / ED Diagnoses   Final diagnoses:  Right arm weakness    ED Discharge Orders    None       Rolan Bucco, MD 07/16/17 1610    Rolan Bucco, MD 07/16/17 2102

## 2017-07-16 NOTE — ED Notes (Signed)
Spoke with Merchandiser, retail at [employer] and she is aware that the troponin is elevated.

## 2017-07-16 NOTE — ED Notes (Signed)
attempted x 2 to get labs

## 2017-07-16 NOTE — ED Notes (Signed)
To ct

## 2017-07-16 NOTE — ED Notes (Signed)
Report received from Charlena Cross, Charity fundraiser. Pt needs IV, and blood draw. [facility] pending. PT going to [facility] for MRI.

## 2017-07-17 NOTE — Discharge Instructions (Signed)
You were offered admission to the hospital for ongoing evaluation of abnormal lab studies, including elevated troponin (a heart enzyme) and low potassium. You have decided you want to go home despite the recommendation for admission. Be sure to follow up with your doctor this week for recheck and return to the emergency department with any chest pain, weakness, shortness or breath, nausea/vomiting, or for new concern.

## 2017-07-17 NOTE — ED Notes (Signed)
Pt discharged from ED; instructions provided; Pt encouraged to return to ED if symptoms worsen and to f/u with PCP; Pt verbalized understanding of all instructions 

## 2020-01-21 ENCOUNTER — Other Ambulatory Visit: Payer: Self-pay

## 2020-01-21 ENCOUNTER — Emergency Department (HOSPITAL_BASED_OUTPATIENT_CLINIC_OR_DEPARTMENT_OTHER)
Admission: EM | Admit: 2020-01-21 | Discharge: 2020-01-21 | Disposition: A | Discharge status: Home / Self Care | Payer: Medicaid Other | Attending: Emergency Medicine | Admitting: Emergency Medicine

## 2020-01-21 ENCOUNTER — Encounter (HOSPITAL_BASED_OUTPATIENT_CLINIC_OR_DEPARTMENT_OTHER): Payer: Self-pay | Admitting: *Deleted

## 2020-01-21 DIAGNOSIS — Z20822 Contact with and (suspected) exposure to covid-19: Secondary | ICD-10-CM | POA: Diagnosis not present

## 2020-01-21 DIAGNOSIS — R1110 Vomiting, unspecified: Secondary | ICD-10-CM | POA: Diagnosis not present

## 2020-01-21 DIAGNOSIS — Z7984 Long term (current) use of oral hypoglycemic drugs: Secondary | ICD-10-CM | POA: Insufficient documentation

## 2020-01-21 DIAGNOSIS — E119 Type 2 diabetes mellitus without complications: Secondary | ICD-10-CM | POA: Insufficient documentation

## 2020-01-21 DIAGNOSIS — Z7982 Long term (current) use of aspirin: Secondary | ICD-10-CM | POA: Diagnosis not present

## 2020-01-21 DIAGNOSIS — Z8673 Personal history of transient ischemic attack (TIA), and cerebral infarction without residual deficits: Secondary | ICD-10-CM | POA: Insufficient documentation

## 2020-01-21 DIAGNOSIS — R05 Cough: Secondary | ICD-10-CM | POA: Diagnosis present

## 2020-01-21 DIAGNOSIS — Z79899 Other long term (current) drug therapy: Secondary | ICD-10-CM | POA: Insufficient documentation

## 2020-01-21 DIAGNOSIS — I509 Heart failure, unspecified: Secondary | ICD-10-CM | POA: Insufficient documentation

## 2020-01-21 DIAGNOSIS — J069 Acute upper respiratory infection, unspecified: Secondary | ICD-10-CM | POA: Diagnosis not present

## 2020-01-21 DIAGNOSIS — J040 Acute laryngitis: Secondary | ICD-10-CM | POA: Insufficient documentation

## 2020-01-21 DIAGNOSIS — I110 Hypertensive heart disease with heart failure: Secondary | ICD-10-CM | POA: Insufficient documentation

## 2020-01-21 LAB — SARS CORONAVIRUS 2 BY RT PCR (HOSPITAL ORDER, PERFORMED IN ~~LOC~~ HOSPITAL LAB): SARS Coronavirus 2: NEGATIVE

## 2020-01-21 NOTE — ED Provider Notes (Signed)
[facility] EMERGENCY DEPARTMENT Provider Note   CSN: [id number] Arrival date & time: 01/21/20  1113     History Chief Complaint  Patient presents with  . Sore Throat  . Emesis    Sharon May is a 50 y.o. female history of stroke, hypertension, diabetes, presenting to the ED with coughing, congestion, hoarse voice for the past several days.  Said her symptoms began about a week ago.  She noted that her voice is getting hoarse.  She subsequently developed persistent coughing, worse at night when laying flat in bed.  She said during these coughing fits at night, sometimes she vomits clear vomitus during the coughing.    No fevers, chills.  She did receive both Covid vaccines approximately 3 months ago.  She is concerned that she may still have Covid, states she has a small child at home and wants to get retested as well.  HPI     Past Medical History:  Diagnosis Date  . Diabetes mellitus   . Hypertension   . Stroke [facility])     Patient Active Problem List   Diagnosis Date Noted  . Right knee pain 03/15/2017  . Nausea with vomiting 05/18/2014  . CHF (congestive heart failure) (HCC) 05/17/2014  . HLD (hyperlipidemia) 05/17/2014  . Elevated troponin 05/17/2014  . Pancreatitis, acute 05/17/2014  . Pancreatitis 05/17/2014  . Hypertension   . Stroke (HCC)   . Essential hypertension   . UTI (urinary tract infection), uncomplicated 03/20/2014  . Other and unspecified hyperlipidemia 03/19/2014  . Diabetes type 2, uncontrolled (HCC) 03/19/2014  . NICM (nonischemic cardiomyopathy) (HCC) 03/17/2014  . Accelerated hypertension 03/17/2014  . Sinus tachycardia 03/17/2014  . Demand ischemia (HCC) 03/17/2014  . 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 cyst removed    . TEE WITHOUT CARDIOVERSION N/A 03/18/2014   Procedure: TRANSESOPHAGEAL ECHOCARDIOGRAM (TEE);  Surgeon: Chrystie Nose, MD;   Location: [facility] ENDOSCOPY;  Service: Cardiovascular;  Laterality: N/A;  . TONSILLECTOMY       OB History   No obstetric history on file.     Family History  Problem Relation Age of Onset  . Diabetes Mother   . Diabetes Father   . Hypertension Brother     Social History   Tobacco Use  . Smoking status: Never Smoker  . Smokeless tobacco: Never Used  Substance Use Topics  . Alcohol use: No  . Drug use: No    Home Medications Prior to Admission medications   Medication Sig Start Date End Date Taking? Authorizing Provider  amLODipine (NORVASC) 10 MG tablet Take 10 mg by mouth daily.  04/24/14  Yes [provider]  aspirin 325 MG tablet Take 1 tablet (325 mg total) by mouth daily. 03/20/14  Yes Layne Benton, NP  atorvastatin (LIPITOR) 20 MG tablet Take 1 tablet (20 mg total) by mouth daily at 6 PM. 03/20/14  Yes Biby, Jani Files, NP  glipiZIDE (GLUCOTROL) 10 MG tablet Take 10 mg by mouth daily before breakfast.   Yes [provider]  metFORMIN (GLUCOPHAGE) 500 MG tablet Take 1,000 mg by mouth 2 (two) times daily with a meal.    Yes [provider]  atenolol (TENORMIN) 50 MG tablet Take 50 mg by mouth daily.    [provider]  cefUROXime (CEFTIN) 250 MG tablet Take 2 tablets (500 mg total) by mouth 2 (two) times daily with a meal.  05/18/14   Jeralyn Bennett, MD  cloNIDine (CATAPRES) 0.2 MG tablet Take 0.2 mg by mouth 2 (two) times daily.    [provider]  Dextromethorphan-Quinidine 20-10 MG CAPS Take 1 capsule by mouth daily.    [provider]  doxycycline (VIBRAMYCIN) 100 MG capsule Take 1 capsule (100 mg total) by mouth 2 (two) times daily. 11/01/15   Lawyer, Cristal Deer, PA-C  Guaifenesin 1200 MG TB12 Take 1 tablet (1,200 mg total) by mouth 2 (two) times daily. 11/01/15   Lawyer, Cristal Deer, PA-C  hydrALAZINE (APRESOLINE) 25 MG tablet Take 75 mg by mouth 4 (four) times daily.     [provider]    HYDROcodone-acetaminophen (NORCO) 5-325 MG tablet Take 1-2 tablets by mouth every 6 (six) hours as needed. 06/03/16   Geoffery Lyons, MD  indomethacin (INDOCIN) 25 MG capsule Take 1 capsule (25 mg total) by mouth 3 (three) times daily as needed. 06/03/16   Geoffery Lyons, MD  metoprolol succinate (TOPROL-XL) 100 MG 24 hr tablet Take 100 mg by mouth daily.  10/06/10   [provider]  promethazine (PHENERGAN) 25 MG tablet Take 1 tablet (25 mg total) by mouth every 6 (six) hours as needed for nausea or vomiting. 11/04/13   Molpus, John, MD  promethazine-dextromethorphan (PROMETHAZINE-DM) 6.25-15 MG/5ML syrup Take 5 mLs by mouth 4 (four) times daily as needed for cough. 11/01/15   Lawyer, Cristal Deer, PA-C    Allergies    Morphine and related  Review of Systems   Review of Systems  Constitutional: Negative for chills and fever.  HENT: Positive for postnasal drip and voice change. Negative for sore throat.   Eyes: Negative for pain and visual disturbance.  Respiratory: Positive for cough. Negative for shortness of breath.   Cardiovascular: Negative for chest pain and palpitations.  Gastrointestinal: Positive for vomiting. Negative for abdominal pain.  Genitourinary: Negative for dysuria and hematuria.  Musculoskeletal: Negative for arthralgias and back pain.  Skin: Negative for color change and rash.  Neurological: Negative for syncope, light-headedness and headaches.  Psychiatric/Behavioral: Negative for agitation and confusion.  All other systems reviewed and are negative.   Physical Exam Updated Vital Signs BP (!) 140/90 (BP Location: Right Arm)   Pulse 83   Temp 98.5 F (36.9 C) (Oral)   Resp 16   Ht 5\' 4"  (1.626 m)   Wt 68 kg   SpO2 100%   BMI 25.75 kg/m   Physical Exam Vitals and nursing note reviewed.  Constitutional:      General: She is not in acute distress.    Appearance: She is well-developed.  HENT:     Head: Normocephalic and atraumatic.     Mouth/Throat:      Mouth: Mucous membranes are dry. No oral lesions.     Pharynx: No pharyngeal swelling, oropharyngeal exudate or posterior oropharyngeal erythema.  Eyes:     Conjunctiva/sclera: Conjunctivae normal.  Cardiovascular:     Rate and Rhythm: Normal rate and regular rhythm.     Heart sounds: Normal heart sounds.  Pulmonary:     Effort: Pulmonary effort is normal. No respiratory distress.     Breath sounds: Normal breath sounds.  Abdominal:     Palpations: Abdomen is soft.     Tenderness: There is no abdominal tenderness.  Musculoskeletal:     Cervical back: Neck supple.  Skin:    General: Skin is warm and dry.  Neurological:     Mental Status: She is alert.     GCS: GCS eye  subscore is 4. GCS verbal subscore is 5. GCS motor subscore is 6.     Cranial Nerves: Cranial nerves are intact.     Sensory: Sensation is intact.     Motor: Motor function is intact.     Gait: Gait is intact.     ED Results / Procedures / Treatments   Labs (all labs ordered are listed, but only abnormal results are displayed) Labs Reviewed  SARS CORONAVIRUS 2 BY RT PCR (HOSPITAL ORDER, PERFORMED IN [facility] LAB)    EKG None  Radiology No results found.  Procedures Procedures (including critical care time)  Medications Ordered in ED Medications - No data to display  ED Course  I have reviewed the triage vital signs and the nursing notes.  Pertinent labs & imaging results that were available during my care of the patient were reviewed by me and considered in my medical decision making (see chart for details).  This is a well-appearing 49 year old female presented ED with viral URI type syndrome for the past week. This includes laryngitis, postnasal drip, persistent coughing at night with post-tussive emesis at night.  Overall she is clinically well-appearing in the ED. She does have mild intermittent dry cough. She is afebrile and does not have sirs criteria. I suspect this is a viral  syndrome. She did get Covid vaccinated but is concerned that she may still be infected, which is possible as the vaccine is estimated 90% effective in general coverage and not clear with the delta variant.  With her child at home with asthma I think it's reasonable to test her in the ED.  Otherwise recommended small spoon of honey at night, OTC cough suppressant, PCP follow up.  With her other symptoms, I doubt this laryngitis is related to stroke.  She has an otherwise benign neuro exam.   Final Clinical Impression(s) / ED Diagnoses Final diagnoses:  Laryngitis  Viral URI with cough    Rx / DC Orders ED Discharge Orders    None       Terald Sleeper, MD 01/21/20 [phone]

## 2020-01-21 NOTE — ED Triage Notes (Signed)
Sore throat, vomiting, cough, sob x 3 days.

## 2020-01-21 NOTE — Discharge Instructions (Addendum)
I think you have a viral respiratory infection causing your hoarse voice and coughing.  This usually gets better in 1-2 weeks.  The coughing at night can last 4-6 weeks due to a postnasal drip.  At night I recommend 1 spoonful of honey before bedtime to help with your coughing.  You can also try Nyquil (over the counter) if your coughing is so severe you can't sleep.  You can try to sleep up on 2 pillows as well.  Your covid test is pending and should result within 1 day.

## 2020-09-21 ENCOUNTER — Other Ambulatory Visit: Payer: Self-pay

## 2020-09-21 ENCOUNTER — Emergency Department (HOSPITAL_BASED_OUTPATIENT_CLINIC_OR_DEPARTMENT_OTHER)
Admission: EM | Admit: 2020-09-21 | Discharge: 2020-09-21 | Disposition: A | Discharge status: Home / Self Care | Payer: Medicaid Other | Attending: Emergency Medicine | Admitting: Emergency Medicine

## 2020-09-21 DIAGNOSIS — R109 Unspecified abdominal pain: Secondary | ICD-10-CM | POA: Insufficient documentation

## 2020-09-21 DIAGNOSIS — Z5321 Procedure and treatment not carried out due to patient leaving prior to being seen by health care provider: Secondary | ICD-10-CM | POA: Diagnosis not present

## 2020-09-21 NOTE — ED Notes (Signed)
Called Pt. Her aunt said the Pt. Left to got and use the restroom at home.  Pt. Said she is coming back to the ED.  Pt. Came to ED via EMS

## 2020-09-24 ENCOUNTER — Emergency Department (HOSPITAL_BASED_OUTPATIENT_CLINIC_OR_DEPARTMENT_OTHER)
Admission: EM | Admit: 2020-09-24 | Discharge: 2020-09-24 | Disposition: A | Discharge status: Home / Self Care | Payer: Medicaid Other | Attending: Emergency Medicine | Admitting: Emergency Medicine

## 2020-09-24 ENCOUNTER — Other Ambulatory Visit: Payer: Self-pay

## 2020-09-24 ENCOUNTER — Encounter (HOSPITAL_BASED_OUTPATIENT_CLINIC_OR_DEPARTMENT_OTHER): Payer: Self-pay

## 2020-09-24 ENCOUNTER — Emergency Department (HOSPITAL_BASED_OUTPATIENT_CLINIC_OR_DEPARTMENT_OTHER): Payer: Medicaid Other

## 2020-09-24 DIAGNOSIS — I509 Heart failure, unspecified: Secondary | ICD-10-CM | POA: Insufficient documentation

## 2020-09-24 DIAGNOSIS — Z7984 Long term (current) use of oral hypoglycemic drugs: Secondary | ICD-10-CM | POA: Insufficient documentation

## 2020-09-24 DIAGNOSIS — R109 Unspecified abdominal pain: Secondary | ICD-10-CM | POA: Diagnosis present

## 2020-09-24 DIAGNOSIS — N12 Tubulo-interstitial nephritis, not specified as acute or chronic: Secondary | ICD-10-CM

## 2020-09-24 DIAGNOSIS — E119 Type 2 diabetes mellitus without complications: Secondary | ICD-10-CM | POA: Diagnosis not present

## 2020-09-24 DIAGNOSIS — Z79899 Other long term (current) drug therapy: Secondary | ICD-10-CM | POA: Insufficient documentation

## 2020-09-24 DIAGNOSIS — Z7982 Long term (current) use of aspirin: Secondary | ICD-10-CM | POA: Diagnosis not present

## 2020-09-24 DIAGNOSIS — A599 Trichomoniasis, unspecified: Secondary | ICD-10-CM | POA: Diagnosis not present

## 2020-09-24 DIAGNOSIS — I110 Hypertensive heart disease with heart failure: Secondary | ICD-10-CM | POA: Insufficient documentation

## 2020-09-24 DIAGNOSIS — I10 Essential (primary) hypertension: Secondary | ICD-10-CM

## 2020-09-24 HISTORY — DX: Unspecified convulsions: R56.9

## 2020-09-24 LAB — CBC WITH DIFFERENTIAL/PLATELET
Abs Immature Granulocytes: 0.11 10*3/uL — ABNORMAL HIGH (ref 0.00–0.07)
Basophils Absolute: 0.1 10*3/uL (ref 0.0–0.1)
Basophils Relative: 0 %
Eosinophils Absolute: 0 10*3/uL (ref 0.0–0.5)
Eosinophils Relative: 0 %
HCT: 38.8 % (ref 36.0–46.0)
Hemoglobin: 13.4 g/dL (ref 12.0–15.0)
Immature Granulocytes: 1 %
Lymphocytes Relative: 16 %
Lymphs Abs: 2.8 10*3/uL (ref 0.7–4.0)
MCH: 29 pg (ref 26.0–34.0)
MCHC: 34.5 g/dL (ref 30.0–36.0)
MCV: 84 fL (ref 80.0–100.0)
Monocytes Absolute: 1.4 10*3/uL — ABNORMAL HIGH (ref 0.1–1.0)
Monocytes Relative: 8 %
Neutro Abs: 13.3 10*3/uL — ABNORMAL HIGH (ref 1.7–7.7)
Neutrophils Relative %: 75 %
Platelets: 373 10*3/uL (ref 150–400)
RBC: 4.62 MIL/uL (ref 3.87–5.11)
RDW: 12 % (ref 11.5–15.5)
WBC: 17.7 10*3/uL — ABNORMAL HIGH (ref 4.0–10.5)
nRBC: 0 % (ref 0.0–0.2)

## 2020-09-24 LAB — COMPREHENSIVE METABOLIC PANEL
ALT: 15 U/L (ref 0–44)
AST: 17 U/L (ref 15–41)
Albumin: 3.9 g/dL (ref 3.5–5.0)
Alkaline Phosphatase: 76 U/L (ref 38–126)
Anion gap: 14 (ref 5–15)
BUN: 17 mg/dL (ref 6–20)
CO2: 27 mmol/L (ref 22–32)
Calcium: 8.6 mg/dL — ABNORMAL LOW (ref 8.9–10.3)
Chloride: 95 mmol/L — ABNORMAL LOW (ref 98–111)
Creatinine, Ser: 1.66 mg/dL — ABNORMAL HIGH (ref 0.44–1.00)
GFR, Estimated: 38 mL/min — ABNORMAL LOW (ref >60)
Glucose, Bld: 112 mg/dL — ABNORMAL HIGH (ref 70–99)
Potassium: 3.3 mmol/L — ABNORMAL LOW (ref 3.5–5.1)
Sodium: 136 mmol/L (ref 135–145)
Total Bilirubin: 0.6 mg/dL (ref 0.3–1.2)
Total Protein: 8.5 g/dL — ABNORMAL HIGH (ref 6.5–8.1)

## 2020-09-24 LAB — URINALYSIS, MICROSCOPIC (REFLEX)

## 2020-09-24 LAB — URINALYSIS, ROUTINE W REFLEX MICROSCOPIC
Bilirubin Urine: NEGATIVE
Glucose, UA: NEGATIVE mg/dL
Ketones, ur: NEGATIVE mg/dL
Nitrite: NEGATIVE
Protein, ur: >300 mg/dL — AB
Specific Gravity, Urine: 1.015 (ref 1.005–1.030)
pH: 6 (ref 5.0–8.0)

## 2020-09-24 LAB — PREGNANCY, URINE: Preg Test, Ur: NEGATIVE

## 2020-09-24 MED ORDER — CEFDINIR 300 MG PO CAPS: 300.0000 mg | ORAL_CAPSULE | Freq: Two times a day (BID) | ORAL | 0 refills | Status: DC

## 2020-09-24 MED ORDER — SODIUM CHLORIDE 0.9 % IV SOLN: INTRAVENOUS | Status: DC | PRN

## 2020-09-24 MED ORDER — SODIUM CHLORIDE 0.9 % IV BOLUS
1000.0000 mL | Freq: Once | INTRAVENOUS | Status: AC
Start: 2020-09-24 — End: 2020-09-24
  Administered 2020-09-24: 1000 mL via INTRAVENOUS

## 2020-09-24 MED ORDER — METRONIDAZOLE 500 MG PO TABS
2000.0000 mg | ORAL_TABLET | Freq: Once | ORAL | Status: AC
  Administered 2020-09-24: 2000 mg via ORAL
  Filled 2020-09-24: qty 4

## 2020-09-24 MED ORDER — IOHEXOL 300 MG/ML  SOLN
100.0000 mL | Freq: Once | INTRAMUSCULAR | Status: AC | PRN
  Administered 2020-09-24: 100 mL via INTRAVENOUS

## 2020-09-24 MED ORDER — SODIUM CHLORIDE 0.9 % IV SOLN
1.0000 g | Freq: Once | INTRAVENOUS | Status: AC
  Administered 2020-09-24: 1 g via INTRAVENOUS
  Filled 2020-09-24: qty 10

## 2020-09-24 MED ORDER — DOXYCYCLINE HYCLATE 100 MG PO CAPS: 100.0000 mg | ORAL_CAPSULE | Freq: Two times a day (BID) | ORAL | 0 refills | Status: DC

## 2020-09-24 MED ORDER — ONDANSETRON HCL 4 MG/2ML IJ SOLN
4.0000 mg | Freq: Once | INTRAMUSCULAR | Status: AC
  Administered 2020-09-24: 4 mg via INTRAVENOUS
  Filled 2020-09-24: qty 2

## 2020-09-24 NOTE — ED Provider Notes (Signed)
[facility] EMERGENCY DEPARTMENT Provider Note   CSN: [id number] Arrival date & time: 09/24/20  1326     History Chief Complaint  Patient presents with  . Abdominal Pain    Sharon May is a 50 y.o. female.  Patient with history of stroke with residual peripheral vision loss and the right lower leg weakness that is mild presents the emergency department for evaluation of abdominal pain that she has had over the past 2 weeks.  No history of abdominal surgeries but states that she has had ovarian cysts in the past.  Patient reports worsening pain in the right side, much worse with movement and with walking.  She has had occasional vomiting.  She has been constipated.  No urinary symptoms.  No chest pain or shortness of breath.  No fever or chills.  No vaginal bleeding or discharge.        Past Medical History:  Diagnosis Date  . Diabetes mellitus   . Hypertension   . Seizures (HCC)   . Stroke [facility])     Patient Active Problem List   Diagnosis Date Noted  . Right knee pain 03/15/2017  . Nausea with vomiting 05/18/2014  . CHF (congestive heart failure) (HCC) 05/17/2014  . HLD (hyperlipidemia) 05/17/2014  . Elevated troponin 05/17/2014  . Pancreatitis, acute 05/17/2014  . Pancreatitis 05/17/2014  . Hypertension   . Stroke (HCC)   . Essential hypertension   . UTI (urinary tract infection), uncomplicated 03/20/2014  . Other and unspecified hyperlipidemia 03/19/2014  . Diabetes type 2, uncontrolled (HCC) 03/19/2014  . NICM (nonischemic cardiomyopathy) (HCC) 03/17/2014  . Accelerated hypertension 03/17/2014  . Sinus tachycardia 03/17/2014  . Demand ischemia (HCC) 03/17/2014  . 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 cyst removed    . TEE WITHOUT CARDIOVERSION N/A 03/18/2014   Procedure: TRANSESOPHAGEAL ECHOCARDIOGRAM (TEE);  Surgeon: Chrystie Nose, MD;  Location: [facility] ENDOSCOPY;   Service: Cardiovascular;  Laterality: N/A;  . TONSILLECTOMY       OB History   No obstetric history on file.     Family History  Problem Relation Age of Onset  . Diabetes Mother   . Diabetes Father   . Hypertension Brother     Social History   Tobacco Use  . Smoking status: Never Smoker  . Smokeless tobacco: Never Used  Vaping Use  . Vaping Use: Never used  Substance Use Topics  . Alcohol use: No  . Drug use: No    Home Medications Prior to Admission medications   Medication Sig Start Date End Date Taking? Authorizing Provider  amLODipine (NORVASC) 10 MG tablet Take 10 mg by mouth daily.  04/24/14   [provider]  aspirin 325 MG tablet Take 1 tablet (325 mg total) by mouth daily. 03/20/14   Layne Benton, NP  atenolol (TENORMIN) 50 MG tablet Take 50 mg by mouth daily.    [provider]  atorvastatin (LIPITOR) 20 MG tablet Take 1 tablet (20 mg total) by mouth daily at 6 PM. 03/20/14   Layne Benton, NP  cefUROXime (CEFTIN) 250 MG tablet Take 2 tablets (500 mg total) by mouth 2 (two) times daily with a meal. 05/18/14   Jeralyn Bennett, MD  cloNIDine (CATAPRES) 0.2 MG tablet Take 0.2 mg by mouth 2 (two) times daily.    [provider]  Dextromethorphan-Quinidine 20-10 MG CAPS Take 1 capsule by mouth daily.  [provider]  doxycycline (VIBRAMYCIN) 100 MG capsule Take 1 capsule (100 mg total) by mouth 2 (two) times daily. 11/01/15   Lawyer, Cristal Deer, PA-C  glipiZIDE (GLUCOTROL) 10 MG tablet Take 10 mg by mouth daily before breakfast.    [provider]  Guaifenesin 1200 MG TB12 Take 1 tablet (1,200 mg total) by mouth 2 (two) times daily. 11/01/15   Lawyer, Cristal Deer, PA-C  hydrALAZINE (APRESOLINE) 25 MG tablet Take 75 mg by mouth 4 (four) times daily.     [provider]  HYDROcodone-acetaminophen (NORCO) 5-325 MG tablet Take 1-2 tablets by mouth every 6 (six) hours as needed. 06/03/16   Geoffery Lyons, MD   indomethacin (INDOCIN) 25 MG capsule Take 1 capsule (25 mg total) by mouth 3 (three) times daily as needed. 06/03/16   Geoffery Lyons, MD  metFORMIN (GLUCOPHAGE) 500 MG tablet Take 1,000 mg by mouth 2 (two) times daily with a meal.     [provider]  metoprolol succinate (TOPROL-XL) 100 MG 24 hr tablet Take 100 mg by mouth daily.  10/06/10   [provider]  promethazine (PHENERGAN) 25 MG tablet Take 1 tablet (25 mg total) by mouth every 6 (six) hours as needed for nausea or vomiting. 11/04/13   Molpus, John, MD  promethazine-dextromethorphan (PROMETHAZINE-DM) 6.25-15 MG/5ML syrup Take 5 mLs by mouth 4 (four) times daily as needed for cough. 11/01/15   Lawyer, Cristal Deer, PA-C    Allergies    Morphine and related  Review of Systems   Review of Systems  Constitutional: Negative for fever.  HENT: Negative for rhinorrhea and sore throat.   Eyes: Negative for redness.  Respiratory: Negative for cough and shortness of breath.   Cardiovascular: Negative for chest pain.  Gastrointestinal: Positive for abdominal pain, constipation, nausea and vomiting. Negative for diarrhea.  Genitourinary: Negative for dysuria, frequency, hematuria, urgency and vaginal bleeding.  Musculoskeletal: Negative for myalgias.  Skin: Negative for rash.  Neurological: Negative for headaches.    Physical Exam Updated Vital Signs BP (!) 166/90 (BP Location: Left Arm)   Pulse (!) 107   Temp 99.4 F (37.4 C)   Resp 20   Ht 5\' 4"  (1.626 m)   Wt 68 kg   SpO2 96%   BMI 25.75 kg/m   Physical Exam Vitals and nursing note reviewed.  Constitutional:      General: She is not in acute distress.    Appearance: She is well-developed.  HENT:     Head: Normocephalic and atraumatic.     Right Ear: External ear normal.     Left Ear: External ear normal.     Nose: Nose normal.  Eyes:     Conjunctiva/sclera: Conjunctivae normal.  Cardiovascular:     Rate and Rhythm: Normal rate and regular rhythm.      Heart sounds: No murmur heard.   Pulmonary:     Effort: No respiratory distress.     Breath sounds: No wheezing, rhonchi or rales.  Abdominal:     Palpations: Abdomen is soft.     Tenderness: There is abdominal tenderness in the right upper quadrant, right lower quadrant and suprapubic area. There is no guarding or rebound. Positive signs include psoas sign.     Comments: Patient looks very uncomfortable with movement and even transitioning from the chair to the exam bed.  She clutches her right side when she moves.  She walks very slowly while doing this.   Musculoskeletal:     Cervical back: Normal range of  motion and neck supple.     Right lower leg: No edema.     Left lower leg: No edema.  Skin:    General: Skin is warm and dry.     Findings: No rash.  Neurological:     General: No focal deficit present.     Mental Status: She is alert. Mental status is at baseline.     Motor: No weakness.  Psychiatric:        Mood and Affect: Mood normal.     ED Results / Procedures / Treatments   Labs (all labs ordered are listed, but only abnormal results are displayed) Labs Reviewed  CBC WITH DIFFERENTIAL/PLATELET - Abnormal; Notable for the following components:      Result Value   WBC 17.7 (*)    Neutro Abs 13.3 (*)    Monocytes Absolute 1.4 (*)    Abs Immature Granulocytes 0.11 (*)    All other components within normal limits  COMPREHENSIVE METABOLIC PANEL - Abnormal; Notable for the following components:   Potassium 3.3 (*)    Chloride 95 (*)    Glucose, Bld 112 (*)    Creatinine, Ser 1.66 (*)    Calcium 8.6 (*)    Total Protein 8.5 (*)    GFR, Estimated 38 (*)    All other components within normal limits  URINALYSIS, ROUTINE W REFLEX MICROSCOPIC - Abnormal; Notable for the following components:   APPearance CLOUDY (*)    Hgb urine dipstick MODERATE (*)    Protein, ur >300 (*)    Leukocytes,Ua LARGE (*)    All other components within normal limits  URINALYSIS,  MICROSCOPIC (REFLEX) - Abnormal; Notable for the following components:   Bacteria, UA MANY (*)    Trichomonas, UA PRESENT (*)    All other components within normal limits  URINE CULTURE  PREGNANCY, URINE  GC/CHLAMYDIA PROBE AMP ([place]) NOT AT [facility]    EKG None  Radiology CT ABDOMEN PELVIS W CONTRAST  Result Date: 09/24/2020 CLINICAL DATA:  Right lower quadrant pain and constipation for 2 weeks EXAM: CT ABDOMEN AND PELVIS WITH CONTRAST TECHNIQUE: Multidetector CT imaging of the abdomen and pelvis was performed using the standard protocol following bolus administration of intravenous contrast. CONTRAST:  OMNIPAQUE IOHEXOL 300 MG/ML  SOLN COMPARISON:  05/17/2014, 03/15/2012 report only images unavailable FINDINGS: Lower chest: No acute pleural or parenchymal lung disease. Hepatobiliary: No focal liver abnormality is seen. No gallstones, gallbladder wall thickening, or biliary dilatation. Pancreas: Unremarkable. No pancreatic ductal dilatation or surrounding inflammatory changes. Spleen: Normal in size without focal abnormality. Adrenals/Urinary Tract: Focal area of decreased enhancement upper pole right kidney measuring approximately 2.7 cm, most consistent with focal pyelonephritis. No evidence of renal abscess. The left kidney is unremarkable. No urinary tract calculi or obstructive uropathy. Bladder is decompressed, limiting its evaluation. The adrenals are normal. Stomach/Bowel: No bowel obstruction or ileus. Normal appendix right mid abdomen. No bowel wall thickening or inflammatory change. Vascular/Lymphatic: Aortic atherosclerosis. No enlarged abdominal or pelvic lymph nodes. Reproductive: IUD identified within the endometrial cavity. The uterus is otherwise unremarkable. There are bilateral fat containing adnexal masses. Right adnexal masses located anteriorly in the right hemipelvis measuring 7.4 x 7.0 x 5.3 cm. The left adnexal mass extends posteriorly along the uterus, and measures  7.3 x 3.4 by 4.5 cm. 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 If further evaluation is desired, or if ovarian torsion is suspected, ultrasound is recommended. Other: Small amount of free fluid within the  pelvis. No free intraperitoneal gas. No abdominal wall hernia. Musculoskeletal: No acute or destructive bony lesions. Reconstructed images demonstrate no additional findings. IMPRESSION: 1. Small region of decreased enhancement within the upper pole right kidney, consistent with focal pyelonephritis. No evidence of renal abscess. 2. Bilateral fat containing adnexal masses, consistent with ovarian dermoids. If further evaluation is desired, or if ovarian torsion as clinically suspected, ultrasound may be useful. 3. Trace pelvic free fluid, likely physiologic. 4.  Aortic Atherosclerosis (ICD10-I70.0). Electronically Signed   By: Sharlet SalinaMichael  Brown M.D.   On: 09/24/2020 16:34    Procedures Procedures   Medications Ordered in ED Medications  0.9 %  sodium chloride infusion ( Intravenous New Bag/Given 09/24/20 1702)  sodium chloride 0.9 % bolus 1,000 mL (1,000 mLs Intravenous New Bag/Given 09/24/20 1535)  ondansetron (ZOFRAN) injection 4 mg (4 mg Intravenous Given 09/24/20 1541)  iohexol (OMNIPAQUE) 300 MG/ML solution 100 mL (100 mLs Intravenous Contrast Given 09/24/20 1606)  cefTRIAXone (ROCEPHIN) 1 g in sodium chloride 0.9 % 100 mL IVPB (1 g Intravenous New Bag/Given 09/24/20 1705)  metroNIDAZOLE (FLAGYL) tablet 2,000 mg (2,000 mg Oral Given 09/24/20 1705)    ED Course  I have reviewed the triage vital signs and the nursing notes.  Pertinent labs & imaging results that were available during my care of the patient were reviewed by me and considered in my medical decision making (see chart for details).  Patient seen and examined. Work-up initiated.   Vital signs reviewed and are as follows: BP (!) 166/90 (BP Location: Left Arm)   Pulse (!) 107   Temp 99.4 F (37.4 C)   Resp 20   Ht 5'  4" (1.626 m)   Wt 68 kg   SpO2 96%   BMI 25.75 kg/m   CT reviewed. Suggests right-sided pyelonephritis.  I am not concerned about ovarian torsion today.  UA with signs of infection as well as trichomonas.  I discussed the results with patient.  I confirm that she is not having any vaginal discharge pain is right lateral abdomen, not pelvic pain.  We did discuss the possibility of pelvic inflammatory disease.  I offered pelvic exam to evaluate for this and to evaluate for other potential sexual transmitted infections.  She declines pelvic exam today.  She states that she would like to follow-up with her OB/GYN.  She will be treated for UTI/gonorrhea with IV Rocephin. Will d/c with rx for pyelo and STI.   6:06 PM Pt has received meds. She is ready for d/c.     MDM Rules/Calculators/A&P                          Flank/abdominal pain: WBC elevated and patient appears uncomfortable, but symptoms ongoing for 2 weeks.  Vitals are stable, no fever. Labs elevated WBC, creatinine, UA with trichomonas and WBCs. Imaging CT suggests pyelo. No signs of dehydration, patient is tolerating PO's. Lungs are clear and no signs suggestive of PNA. Pt denies pelvic pain, vaginal d/c, and defers pelvic exam to her GYN.  Low concern for appendicitis, cholecystitis, pancreatitis, ruptured viscus, UTI, kidney stone, aortic dissection, aortic aneurysm or other emergent abdominal etiology. Considered ovarian torsion, PID as well.  Supportive therapy indicated with return if symptoms worsen.   For pyelo: given IV Rocephin, home with cefdinir.  For trichomonas: Given p.o. metronidazole, IV Rocephin.  Home with course of doxycycline to cover for chlamydia.  Low concern for PID at this time.  HTN:  BP progressively elevated.  No stroke signs or other symptoms of endorgan damage.  Resume home meds upon returning home.   CKD: Treated with IV fluids, likely close to baseline   Final Clinical Impression(s) / ED Diagnoses Final  diagnoses:  Pyelonephritis  Trichomoniasis  Hypertension, unspecified type    Rx / DC Orders ED Discharge Orders         Ordered    cefdinir (OMNICEF) 300 MG capsule  2 times daily        09/24/20 1759    doxycycline (VIBRAMYCIN) 100 MG capsule  2 times daily        09/24/20 1759           Renne Crigler, PA-C 09/24/20 1807    Jacalyn Lefevre, MD 09/25/20 [phone]

## 2020-09-24 NOTE — ED Triage Notes (Signed)
Pt c/o R side abd pain and constipation x 2 weeks. Pt last had diarrhea yesterday per pt. Pt has vomited x 1 in past 24 hours.

## 2020-09-24 NOTE — Discharge Instructions (Signed)
Please read and follow all provided instructions.  Your diagnoses today include:  1. Pyelonephritis   2. Trichomoniasis   3. Hypertension, unspecified type     Tests performed today include:  Blood cell counts and platelets - high white blood cell counts  Kidney and liver function tests - slightly high kidney test  Urine test to look for infection - shows signs of infection  A blood or urine test for pregnancy (women only)  CT scan of the abdomen - shows possible kidney infection  Vital signs. See below for your results today.   Medications prescribed:   Doxycycline - antibiotic to cover for vaginal infection  You have been prescribed an antibiotic medicine: take the entire course of medicine even if you are feeling better. Stopping early can cause the antibiotic not to work.   Cefdinir - antibiotic for urinary tract infection  Take any prescribed medications only as directed.  Home care instructions:   Follow any educational materials contained in this packet.  Follow-up instructions: Please follow-up with your primary care provider and GYN in the next 7 days for further evaluation of your symptoms.    Return instructions:  SEEK IMMEDIATE MEDICAL ATTENTION IF:  The pain does not go away or becomes severe   A temperature above 101F develops   Repeated vomiting occurs (multiple episodes)   The pain becomes localized to portions of the abdomen. The right side could possibly be appendicitis. In an adult, the left lower portion of the abdomen could be colitis or diverticulitis.   Blood is being passed in stools or vomit (bright red or black tarry stools)   You develop chest pain, difficulty breathing, dizziness or fainting, or become confused, poorly responsive, or inconsolable (young children)  If you have any other emergent concerns regarding your health  Additional Information: Abdominal (belly) pain can be caused by many things. Your caregiver performed an  examination and possibly ordered blood/urine tests and imaging (CT scan, x-rays, ultrasound). Many cases can be observed and treated at home after initial evaluation in the emergency department. Even though you are being discharged home, abdominal pain can be unpredictable. Therefore, you need a repeated exam if your pain does not resolve, returns, or worsens. Most patients with abdominal pain don't have to be admitted to the hospital or have surgery, but serious problems like appendicitis and gallbladder attacks can start out as nonspecific pain. Many abdominal conditions cannot be diagnosed in one visit, so follow-up evaluations are very important.  Your vital signs today were: BP (!) 200/96   Pulse 95   Temp 99.4 F (37.4 C)   Resp 20   Ht 5\' 4"  (1.626 m)   Wt 68 kg   SpO2 100%   BMI 25.75 kg/m  If your blood pressure (bp) was elevated above 135/85 this visit, please have this repeated by your doctor within one month. --------------

## 2020-09-24 NOTE — ED Notes (Signed)
Pt ambulatory to [facility]

## 2020-09-26 LAB — URINE CULTURE

## 2020-09-27 LAB — GC/CHLAMYDIA PROBE AMP (~~LOC~~) NOT AT ARMC
Chlamydia: NEGATIVE
Comment: NEGATIVE
Comment: NORMAL
Neisseria Gonorrhea: NEGATIVE

## 2021-08-06 IMAGING — CT CT ABD-PELV W/ CM
2 of 5 series · 15 of 46 positions shown, 17 images · IV contrast (omnipaque)
Comparison: 05/17/2014, 03/15/2012 report only images unavailable

CLINICAL DATA: Right lower quadrant pain and constipation for 2
weeks

EXAM:
CT ABDOMEN AND PELVIS WITH CONTRAST
TECHNIQUE: Multidetector CT imaging of the abdomen and pelvis was performed
using the standard protocol following bolus administration of
intravenous contrast.
CONTRAST:  100mL OMNIPAQUE IOHEXOL 300 MG/ML  SOLN

[Series 2: axial st · axial · 0.83mm/px · z∈[-376,-1]mm · 12 of 86 slices shown, 14 images]
[im 6/86  soft-tissue]
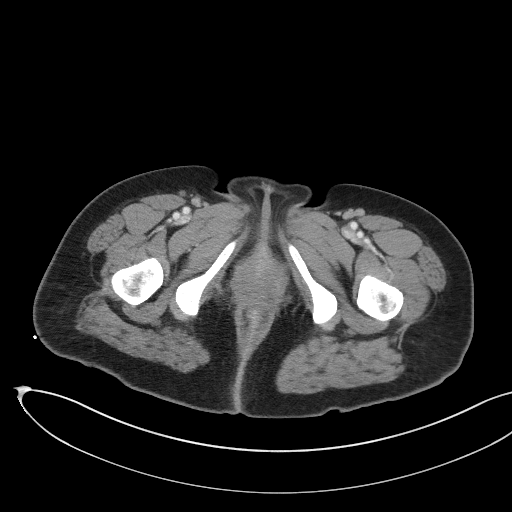
[im 6/86  bone]
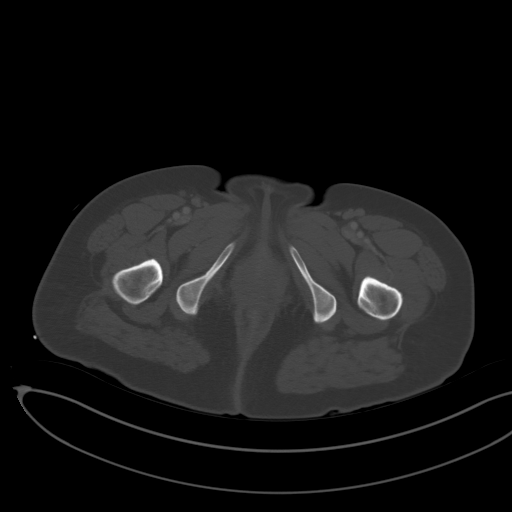
[im 16/86  soft-tissue]
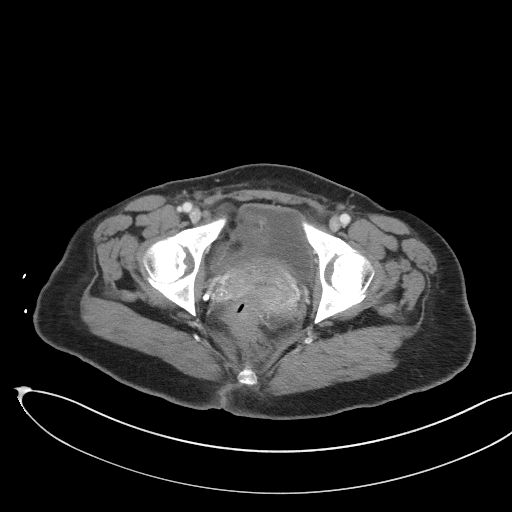
[im 21/86  soft-tissue]
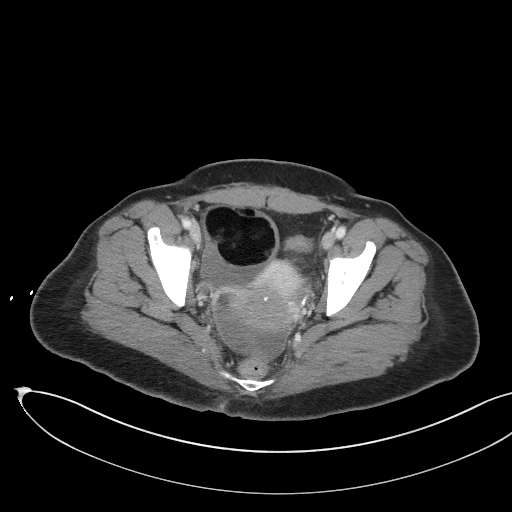
[im 26/86  soft-tissue]
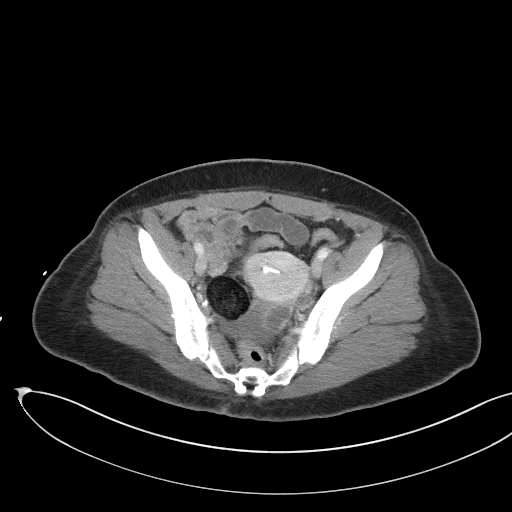
[im 36/86  soft-tissue]
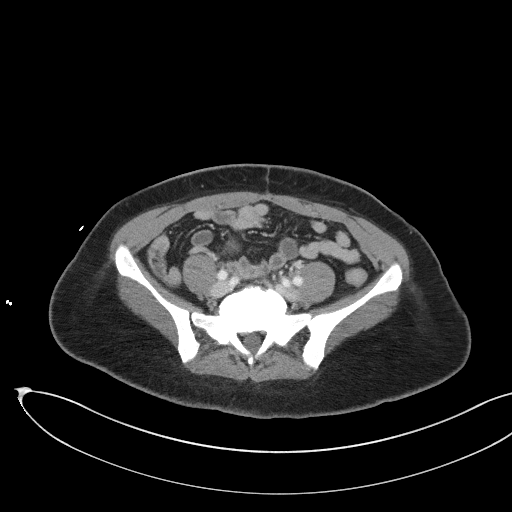
[im 41/86  soft-tissue]
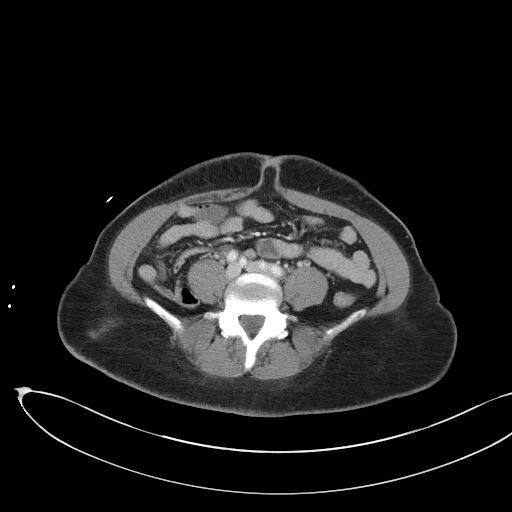
[im 46/86  soft-tissue]
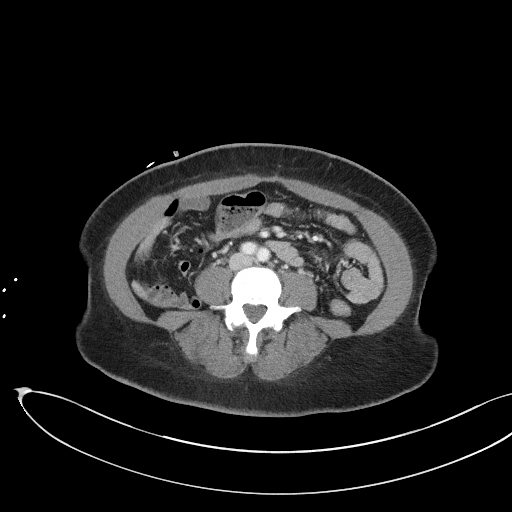
[im 56/86  soft-tissue]
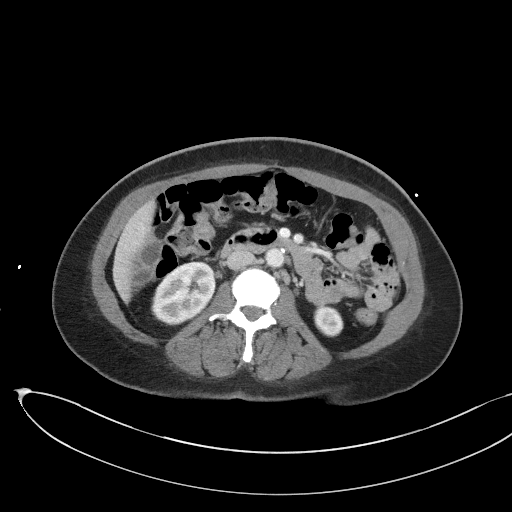
[im 61/86  soft-tissue]
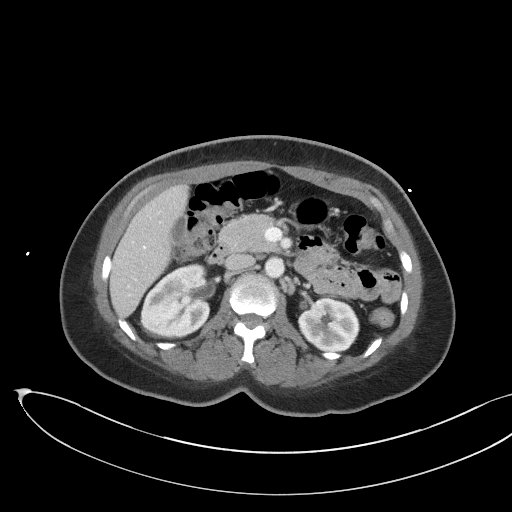
[im 61/86  bone]
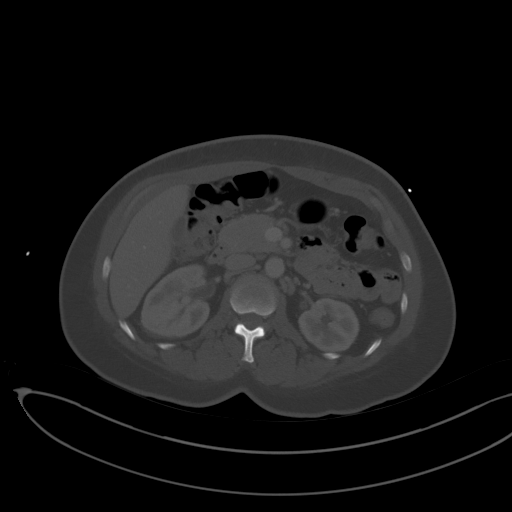
[im 66/86  soft-tissue]
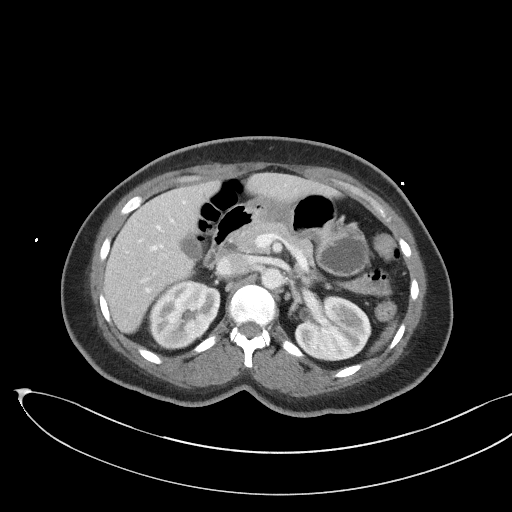
[im 76/86  soft-tissue]
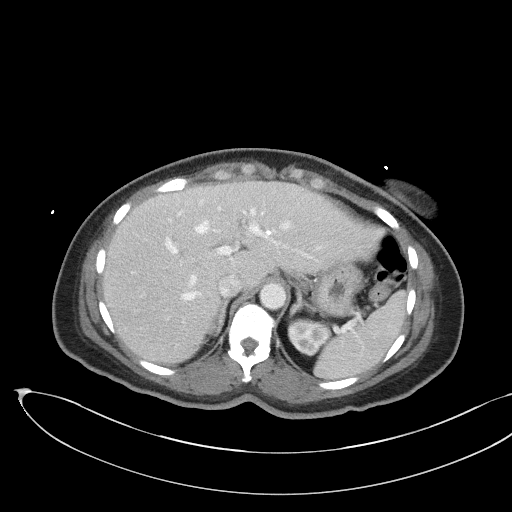
[im 81/86  soft-tissue]
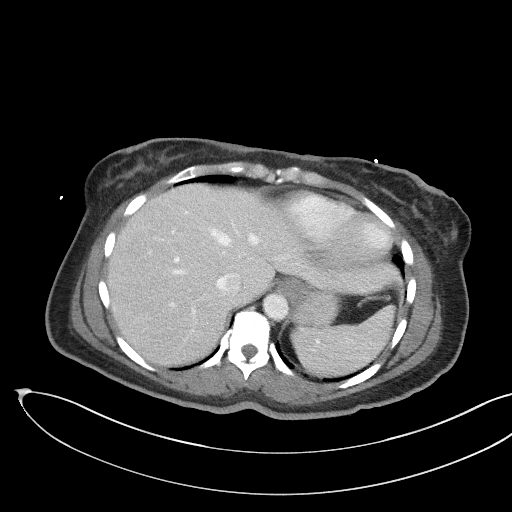

[Series 5: coronal st · coronal · 0.69mm/px · 3 of 99 slices shown]
[im 33/99  soft-tissue]
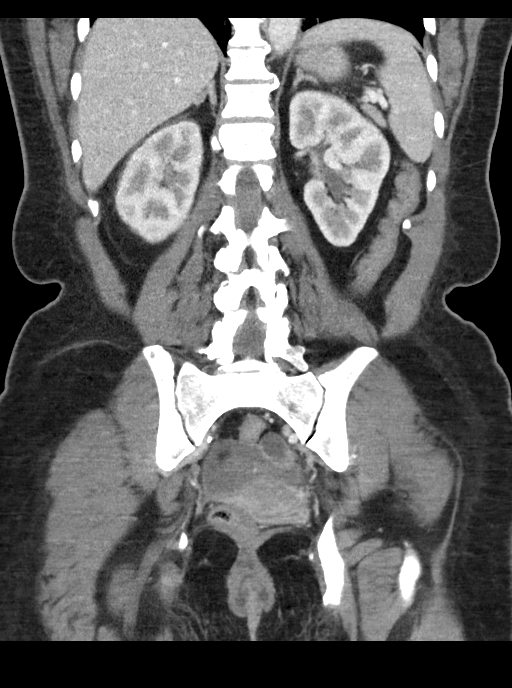
[im 44/99  soft-tissue]
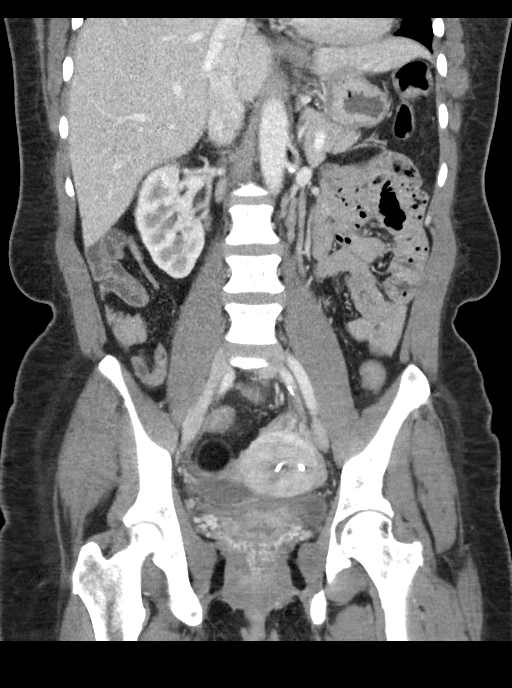
[im 55/99  soft-tissue]
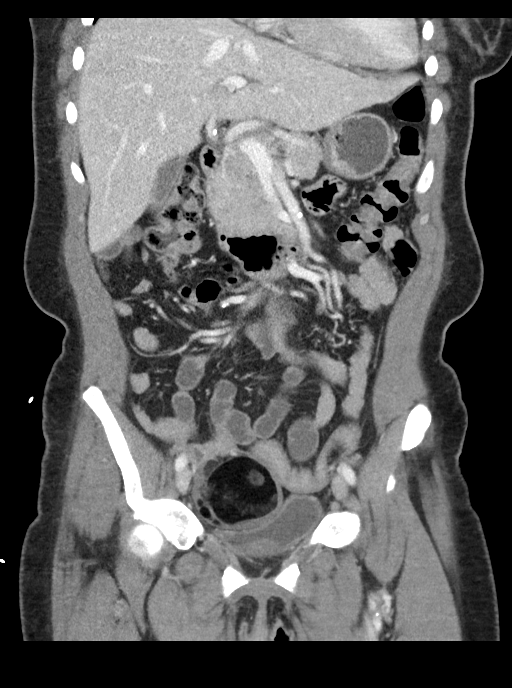

[15 of 46 positions shown; findings below may reference images not displayed]

FINDINGS: Lower chest: No acute pleural or parenchymal lung disease.

Hepatobiliary: No focal liver abnormality is seen. No gallstones,
gallbladder wall thickening, or biliary dilatation.

Pancreas: Unremarkable. No pancreatic ductal dilatation or
surrounding inflammatory changes.

Spleen: Normal in size without focal abnormality.

Adrenals/Urinary Tract: Focal area of decreased enhancement upper
pole right kidney measuring approximately 2.7 cm, most consistent
with focal pyelonephritis. No evidence of renal abscess. The left
kidney is unremarkable. No urinary tract calculi or obstructive
uropathy. Bladder is decompressed, limiting its evaluation. The
adrenals are normal.

Stomach/Bowel: No bowel obstruction or ileus. Normal appendix right
mid abdomen. No bowel wall thickening or inflammatory change.

Vascular/Lymphatic: Aortic atherosclerosis. No enlarged abdominal or
pelvic lymph nodes.

Reproductive: IUD identified within the endometrial cavity. The
uterus is otherwise unremarkable.

There are bilateral fat containing adnexal masses. Right adnexal
masses located anteriorly in the right hemipelvis measuring 7.4 x
7.0 x 5.3 cm. The left adnexal mass extends posteriorly along the
uterus, and measures 7.3 x 3.4 by 4.5 cm. Findings are consistent
with bilateral ovarian dermoids. If further evaluation is desired,
or if ovarian torsion is suspected, ultrasound is recommended.

Other: Small amount of free fluid within the pelvis. No free
intraperitoneal gas. No abdominal wall hernia.

Musculoskeletal: No acute or destructive bony lesions. Reconstructed
images demonstrate no additional findings.
IMPRESSION: 1. Small region of decreased enhancement within the upper pole right
kidney, consistent with focal pyelonephritis. No evidence of renal
abscess.
2. Bilateral fat containing adnexal masses, consistent with ovarian
dermoids. If further evaluation is desired, or if ovarian torsion as
clinically suspected, ultrasound may be useful.
3. Trace pelvic free fluid, likely physiologic.
4.  Aortic Atherosclerosis ([id number]).

## 2021-08-27 ENCOUNTER — Other Ambulatory Visit: Payer: Self-pay

## 2021-08-27 ENCOUNTER — Emergency Department (HOSPITAL_BASED_OUTPATIENT_CLINIC_OR_DEPARTMENT_OTHER): Payer: Medicaid Other

## 2021-08-27 ENCOUNTER — Emergency Department (HOSPITAL_BASED_OUTPATIENT_CLINIC_OR_DEPARTMENT_OTHER)
Admission: EM | Admit: 2021-08-27 | Discharge: 2021-08-27 | Disposition: A | Discharge status: Home / Self Care | Payer: Medicaid Other | Attending: Emergency Medicine | Admitting: Emergency Medicine

## 2021-08-27 ENCOUNTER — Encounter (HOSPITAL_BASED_OUTPATIENT_CLINIC_OR_DEPARTMENT_OTHER): Payer: Self-pay | Admitting: Emergency Medicine

## 2021-08-27 DIAGNOSIS — Z7901 Long term (current) use of anticoagulants: Secondary | ICD-10-CM | POA: Diagnosis not present

## 2021-08-27 DIAGNOSIS — R0981 Nasal congestion: Secondary | ICD-10-CM | POA: Diagnosis not present

## 2021-08-27 DIAGNOSIS — R059 Cough, unspecified: Secondary | ICD-10-CM | POA: Insufficient documentation

## 2021-08-27 DIAGNOSIS — Z20822 Contact with and (suspected) exposure to covid-19: Secondary | ICD-10-CM | POA: Insufficient documentation

## 2021-08-27 DIAGNOSIS — Z7984 Long term (current) use of oral hypoglycemic drugs: Secondary | ICD-10-CM | POA: Diagnosis not present

## 2021-08-27 DIAGNOSIS — I10 Essential (primary) hypertension: Secondary | ICD-10-CM | POA: Insufficient documentation

## 2021-08-27 DIAGNOSIS — J069 Acute upper respiratory infection, unspecified: Secondary | ICD-10-CM

## 2021-08-27 DIAGNOSIS — K5900 Constipation, unspecified: Secondary | ICD-10-CM

## 2021-08-27 DIAGNOSIS — K5909 Other constipation: Secondary | ICD-10-CM | POA: Insufficient documentation

## 2021-08-27 LAB — RESP PANEL BY RT-PCR (FLU A&B, COVID) ARPGX2
Influenza A by PCR: NEGATIVE
Influenza B by PCR: NEGATIVE
SARS Coronavirus 2 by RT PCR: NEGATIVE

## 2021-08-27 MED ORDER — HYDRALAZINE HCL 25 MG PO TABS
50.0000 mg | ORAL_TABLET | Freq: Once | ORAL | Status: AC
Start: 2021-08-27 — End: 2021-08-27
  Administered 2021-08-27: 50 mg via ORAL
  Filled 2021-08-27: qty 2

## 2021-08-27 MED ORDER — POLYETHYLENE GLYCOL 3350 17 G PO PACK: 17.0000 g | PACK | Freq: Every day | ORAL | 0 refills | Status: AC

## 2021-08-27 MED ORDER — BENZONATATE 100 MG PO CAPS: 100.0000 mg | ORAL_CAPSULE | Freq: Three times a day (TID) | ORAL | 0 refills | Status: AC

## 2021-08-27 MED ORDER — BENZONATATE 100 MG PO CAPS
200.0000 mg | ORAL_CAPSULE | Freq: Once | ORAL | Status: AC
  Administered 2021-08-27: 200 mg via ORAL
  Filled 2021-08-27: qty 2

## 2021-08-27 NOTE — ED Provider Notes (Addendum)
?[facility] EMERGENCY DEPARTMENT ?Provider Note ? ? ?CSN: [id number] ?Arrival date & time: 08/27/21  0500 ? ?  ? ?History ? ?Chief Complaint  ?Patient presents with  ? URI  ? Constipation  ? ? ?Sharon May is a 51 y.o. female. ? ?The history is provided by the patient.  ?URI ?Presenting symptoms: congestion and cough   ?Presenting symptoms: no fever and no sore throat   ?Congestion:  ?  Location:  Nasal ?  Interferes with sleep: no   ?  Interferes with eating/drinking: no   ?Severity:  Moderate ?Onset quality:  Gradual ?Duration:  1 week ?Timing:  Intermittent ?Progression:  Unchanged ?Chronicity:  New ?Relieved by:  Nothing ?Worsened by:  Nothing ?Ineffective treatments:  None tried ?Associated symptoms: no arthralgias, no headaches, no myalgias, no neck pain, no sinus pain, no sneezing, no swollen glands and no wheezing   ?Risk factors: not elderly and no recent travel   ?Constipation ?Severity:  Mild ?Time since last bowel movement: months since starting new medications. ?Chronicity:  Chronic ?Context: medication   ?Stool description:  Pellet like ?Unusual stool frequency:  2 times daily ?Relieved by:  Nothing ?Worsened by:  Nothing ?Ineffective treatments:  Laxatives ?Associated symptoms: no abdominal pain, no anorexia, no back pain, no diarrhea, no dysuria, no fever, no hematochezia, no nausea, no urinary retention and no vomiting   ?Patient with HTN and previous CVA presents with cough and nasal congestion x 1 weeks and chronic constipation.  No f/c/r.  No CP.  Weight is at her baseline.  No leg swelling. The cough is not productive.  She is having twice daily BMs but they are small and she does not believe this is frequent enough.   ?  ? ?Home Medications ?Prior to Admission medications   ?Medication Sig Start Date End Date Taking? Authorizing Provider  ?atorvastatin (LIPITOR) 40 MG tablet Take 40 mg by mouth daily. 08/06/21   [provider]  ?benzonatate (TESSALON) 100 MG capsule Take 200  mg by mouth every 8 (eight) hours as needed. 06/21/21   [provider]  ?ELIQUIS 5 MG TABS tablet Take 5 mg by mouth 2 (two) times daily. 07/28/21   [provider]  ?furosemide (LASIX) 20 MG tablet Take 20 mg by mouth 2 (two) times daily. 08/18/21   [provider]  ?glipiZIDE (GLUCOTROL) 10 MG tablet Take by mouth.    [provider]  ?isosorbide mononitrate (IMDUR) 30 MG 24 hr tablet Take 30 mg by mouth daily. 07/28/21   [provider]  ?levETIRAcetam (KEPPRA) 500 MG tablet Take 500 mg by mouth 2 (two) times daily. 07/07/21   [provider]  ?losartan (COZAAR) 100 MG tablet Take 100 mg by mouth daily. 07/28/21   [provider]  ?   ? ?Allergies    ?Morphine and related   ? ?Review of Systems   ?Review of Systems  ?Constitutional:  Negative for diaphoresis and fever.  ?HENT:  Positive for congestion. Negative for sinus pain, sneezing and sore throat.   ?Respiratory:  Positive for cough. Negative for shortness of breath, wheezing and stridor.   ?Cardiovascular:  Negative for chest pain, palpitations and leg swelling.  ?Gastrointestinal:  Positive for constipation. Negative for abdominal pain, anorexia, diarrhea, hematochezia, nausea and vomiting.  ?Genitourinary:  Negative for dysuria.  ?Musculoskeletal:  Negative for arthralgias, back pain, myalgias and neck pain.  ?Neurological:  Negative for facial asymmetry and headaches.  ?Psychiatric/Behavioral:  Negative for agitation.   ? ?  Physical Exam ?Updated Vital Signs ?BP (!) 185/122 (BP Location: Left Arm)   Pulse 86   Temp 97.6 ?F (36.4 ?C) (Oral)   Resp 18   Ht 5\' 4"  (1.626 m)   Wt 68 kg   SpO2 100%   BMI 25.75 kg/m?  ?Physical Exam ?Vitals and nursing note reviewed. Exam conducted with a chaperone present.  ?Constitutional:   ?   Appearance: Normal appearance.  ?HENT:  ?   Head: Normocephalic and atraumatic.  ?   Nose: Congestion present. No rhinorrhea.  ?   Mouth/Throat:  ?   Mouth: Mucous  membranes are moist.  ?   Pharynx: Oropharynx is clear.  ?Eyes:  ?   Conjunctiva/sclera: Conjunctivae normal.  ?   Pupils: Pupils are equal, round, and reactive to light.  ?Cardiovascular:  ?   Rate and Rhythm: Normal rate and regular rhythm.  ?   Pulses: Normal pulses.  ?   Heart sounds: Normal heart sounds.  ?Pulmonary:  ?   Effort: Pulmonary effort is normal. No respiratory distress.  ?   Breath sounds: Normal breath sounds. No stridor. No wheezing, rhonchi or rales.  ?Chest:  ?   Chest wall: No tenderness.  ?Abdominal:  ?   General: Bowel sounds are normal.  ?   Palpations: Abdomen is soft.  ?   Tenderness: There is no abdominal tenderness. There is no guarding or rebound.  ?   Hernia: No hernia is present.  ?Musculoskeletal:     ?   General: Normal range of motion.  ?   Cervical back: Normal range of motion and neck supple.  ?Skin: ?   General: Skin is warm and dry.  ?   Capillary Refill: Capillary refill takes less than 2 seconds.  ?Neurological:  ?   General: No focal deficit present.  ?   Mental Status: She is alert and oriented to person, place, and time.  ?   Deep Tendon Reflexes: Reflexes normal.  ?Psychiatric:     ?   Mood and Affect: Mood normal.     ?   Behavior: Behavior normal.  ? ? ?ED Results / Procedures / Treatments   ?Labs ?(all labs ordered are listed, but only abnormal results are displayed) ?Labs Reviewed  ?RESP PANEL BY RT-PCR (FLU A&B, COVID) ARPGX2  ? ? ?EKG ?None ? ?Radiology ?No results found. ? ?Procedures ?Procedures  ? ? ?Medications Ordered in ED ?Medications - No data to display ? ?ED Course/ Medical Decision Making/ A&P ?  ?                        ?Medical Decision Making ?Patient with non productive cough and nasal congestion x 1 week.  Also chronic constipation.   ? ?Amount and/or Complexity of Data Reviewed ?External Data Reviewed: notes. ?   Details: previous notes in care everywhere reviewed ?Labs: ordered. ?   Details: covid and flu at negative by my review ?Radiology:  ordered. ?   Details: acute abdominal series reviewed by me: no free air no PNA, no large constipation ?Discussion of management or test interpretation with external provider(s): Patient is well appearing.  She has not taken her am blood pressure medication and I have instructed her to take her dose of hydralazine now.  There is no PNA on Xray.  Start miralax daily for constipation.   ? ?Risk ?Prescription drug management. ? ?Patient is not vomiting and is passing gas and stool and I have  reviewed the films and seen the patient and clinically this is NOT an ileus.   ? ?Final Clinical Impression(s) / ED Diagnoses ?Return for intractable cough, coughing up blood, fevers > 100.4 unrelieved by medication, shortness of breath, intractable vomiting, chest pain, shortness of breath, weakness, numbness, changes in speech, facial asymmetry, abdominal pain, passing out, Inability to tolerate liquids or food, cough, altered mental status or any concerns. No signs of systemic illness or infection. The patient is nontoxic-appearing on exam and vital signs are within normal limits.  ?I have reviewed the triage vital signs and the nursing notes. Pertinent labs & imaging results that were available during my care of the patient were reviewed by me and considered in my medical decision making (see chart for details). After history, exam, and medical workup I feel the patient has been appropriately medically screened and is safe for discharge home. Pertinent diagnoses were discussed with the patient. Patient was given return precautions.  ?  ? ? ?  ?Jaymeson Mengel, MD ?08/27/21 0556 ? ?  ?Mindie Rawdon, MD ?08/27/21 2300 ? ?

## 2021-08-27 NOTE — ED Notes (Signed)
Patient independent with ambulation to bathroom. °

## 2021-08-27 NOTE — ED Notes (Signed)
Patient transported to X-ray 

## 2021-08-27 NOTE — ED Notes (Signed)
Patient discharged to home.  All discharge instructions reviewed.  Patient verbalized understanding via teachback method.  VS WDL.  Respirations even and unlabored.  Ambulatory out of ED.   °

## 2021-08-27 NOTE — ED Triage Notes (Signed)
Pt c/o cough, congestion and constipation. ?

## 2022-07-09 IMAGING — DX DG ABDOMEN ACUTE W/ 1V CHEST
3 series · 3 of 3 positions shown · non-contrast
Comparison: Chest x-ray 05/23/2021

CLINICAL DATA: Coughing congestion.

EXAM:
DG ABDOMEN ACUTE WITH 1 VIEW CHEST

[chest pa]
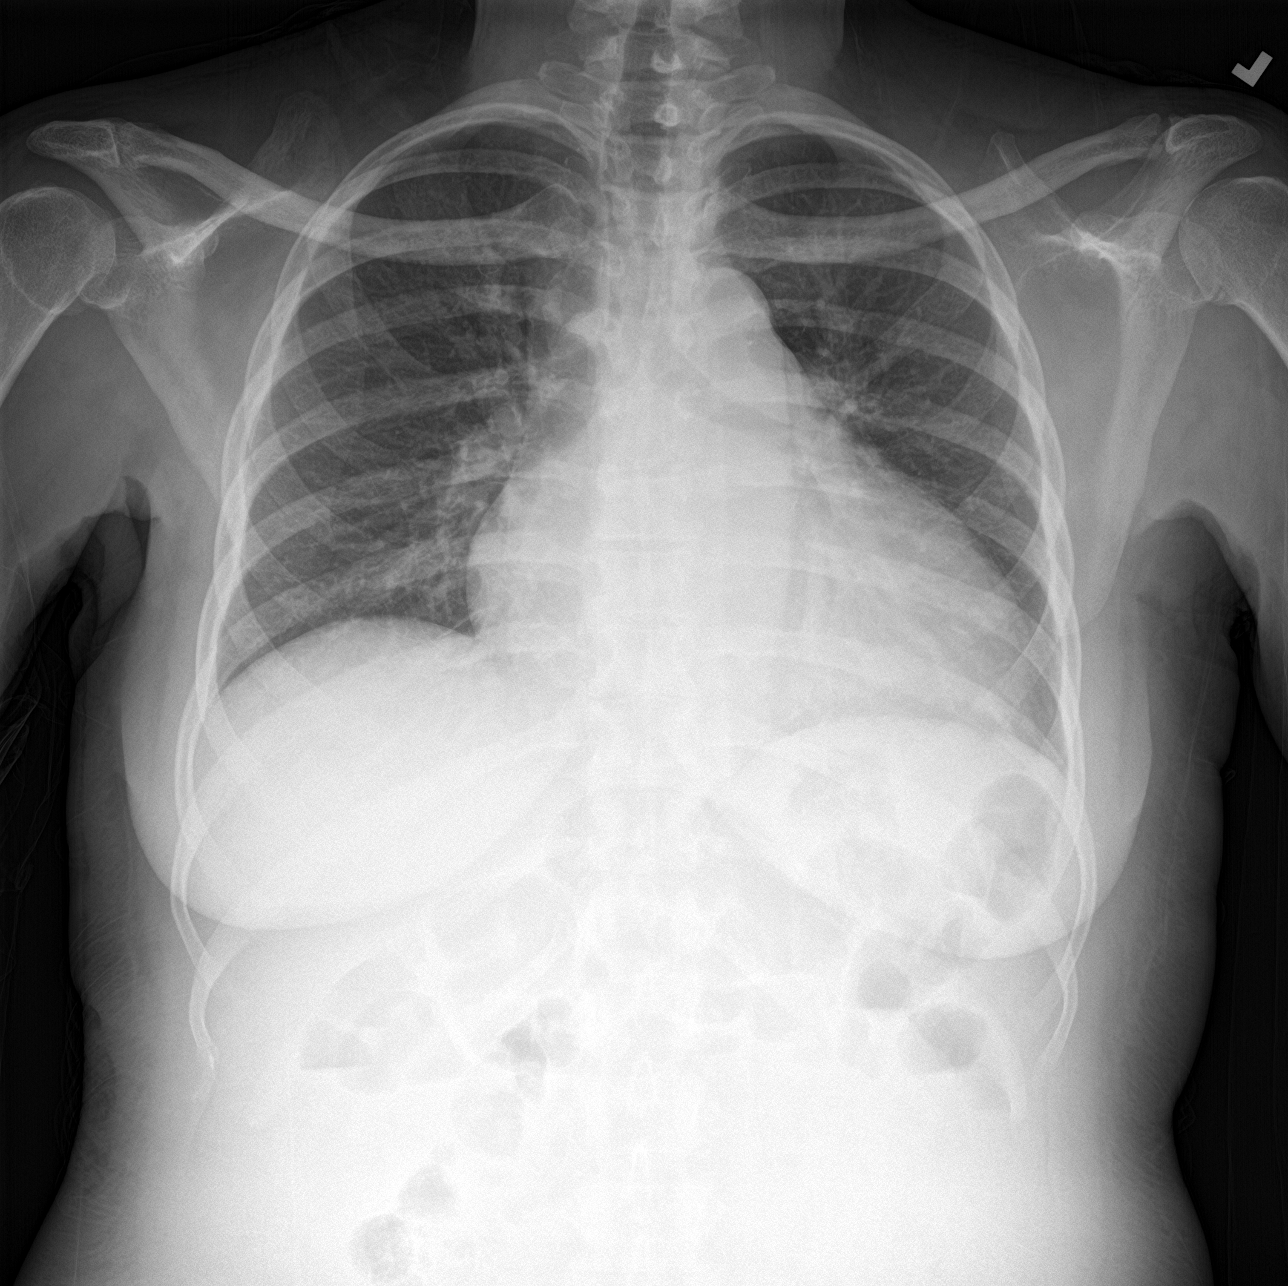

[abdomen erect]
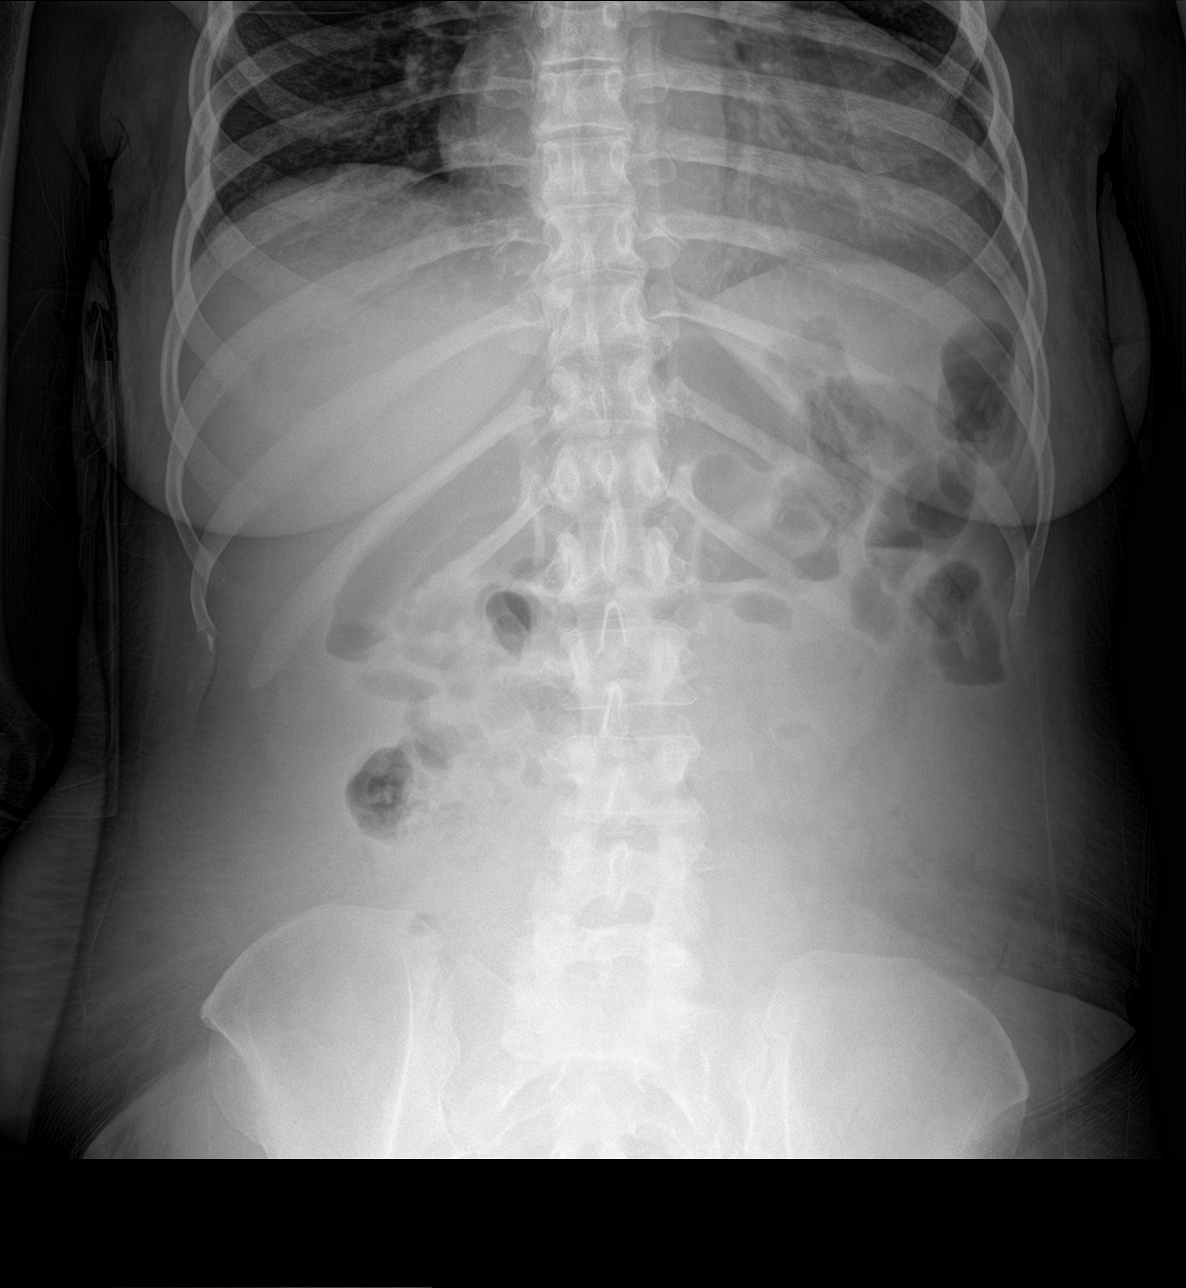

[abdomen supine]
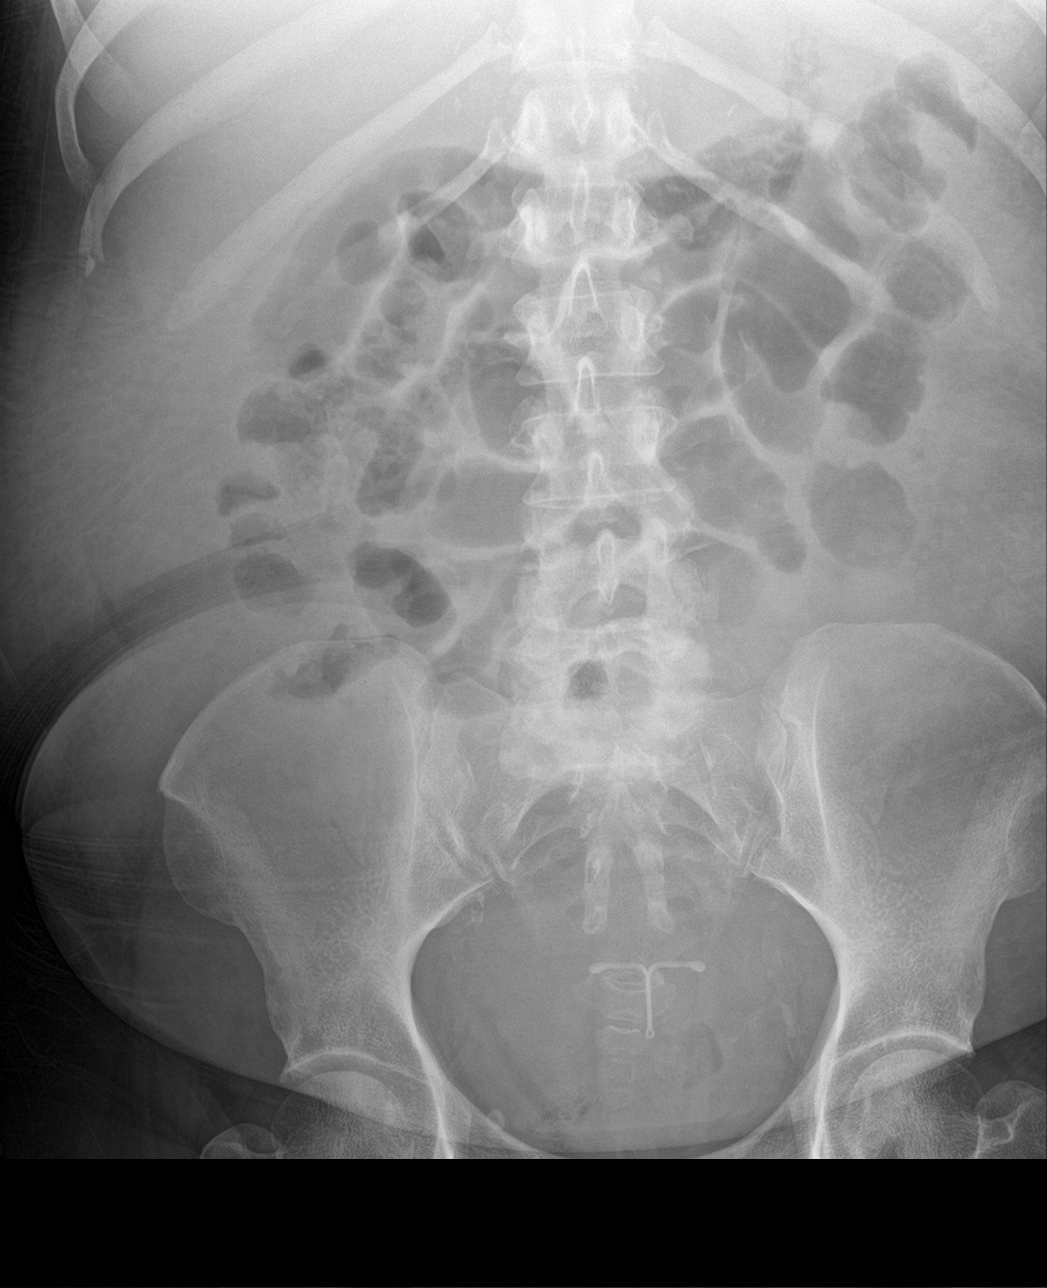

[3 of 3 positions shown; findings below may reference images not displayed]

FINDINGS: The cardio pericardial silhouette is enlarged. The lungs are clear
without focal pneumonia, edema, pneumothorax or pleural effusion.
The visualized bony structures of the thorax are unremarkable.

Upright film shows no evidence for intraperitoneal free air. Mild
gaseous distention of small bowel and colon noted central abdomen.
IUD visualized over the pelvis. Radiopaque foreign body overlies the
symphysis pubis.
IMPRESSION: 1. No acute cardiopulmonary findings.
2. No evidence for bowel perforation or obstruction. Component of
underlying ileus not excluded.

## 2022-08-02 ENCOUNTER — Encounter (HOSPITAL_BASED_OUTPATIENT_CLINIC_OR_DEPARTMENT_OTHER): Payer: Self-pay | Admitting: *Deleted

## 2022-08-02 ENCOUNTER — Emergency Department (HOSPITAL_BASED_OUTPATIENT_CLINIC_OR_DEPARTMENT_OTHER)
Admission: EM | Admit: 2022-08-02 | Discharge: 2022-08-02 | Disposition: A | Discharge status: Home / Self Care | Payer: Medicaid Other | Attending: Emergency Medicine | Admitting: Emergency Medicine

## 2022-08-02 DIAGNOSIS — R051 Acute cough: Secondary | ICD-10-CM | POA: Diagnosis not present

## 2022-08-02 DIAGNOSIS — Z20822 Contact with and (suspected) exposure to covid-19: Secondary | ICD-10-CM | POA: Diagnosis not present

## 2022-08-02 DIAGNOSIS — R519 Headache, unspecified: Secondary | ICD-10-CM | POA: Diagnosis not present

## 2022-08-02 DIAGNOSIS — R059 Cough, unspecified: Secondary | ICD-10-CM | POA: Diagnosis present

## 2022-08-02 LAB — RESP PANEL BY RT-PCR (RSV, FLU A&B, COVID)  RVPGX2
Influenza A by PCR: NEGATIVE
Influenza B by PCR: NEGATIVE
Resp Syncytial Virus by PCR: NEGATIVE
SARS Coronavirus 2 by RT PCR: NEGATIVE

## 2022-08-02 NOTE — ED Triage Notes (Signed)
Here for cough and HA evaluation. Onset approx 2 days ago, has tried some Robitussin with some relief. Is concerned about having Covid

## 2022-08-02 NOTE — ED Provider Notes (Signed)
[place] EMERGENCY DEPARTMENT AT [facility] Provider Note   CSN: [id number] Arrival date & time: 08/02/22  3244     History  Chief Complaint  Patient presents with   Cough    Carlyne Keehan is a 52 y.o. female.  Patient is a 52 year old who presents with a cough and a headache.  She says she has [place] bit of a cough and a [place] bit of a headache that started 2 days ago.  She denies any runny nose congestion.  No fevers.  No myalgias.  No nausea or vomiting.  She had an exposure to COVID and was worried that she may have it.       Home Medications Prior to Admission medications   Medication Sig Start Date End Date Taking? Authorizing Provider  atorvastatin (LIPITOR) 40 MG tablet Take 40 mg by mouth daily. 08/06/21   [provider]  benzonatate (TESSALON) 100 MG capsule Take 200 mg by mouth every 8 (eight) hours as needed. 06/21/21   [provider]  benzonatate (TESSALON) 100 MG capsule Take 1 capsule (100 mg total) by mouth every 8 (eight) hours. 08/27/21   Palumbo, April, MD  ELIQUIS 5 MG TABS tablet Take 5 mg by mouth 2 (two) times daily. 07/28/21   [provider]  furosemide (LASIX) 20 MG tablet Take 20 mg by mouth 2 (two) times daily. 08/18/21   [provider]  glipiZIDE (GLUCOTROL) 10 MG tablet Take by mouth.    [provider]  isosorbide mononitrate (IMDUR) 30 MG 24 hr tablet Take 30 mg by mouth daily. 07/28/21   [provider]  levETIRAcetam (KEPPRA) 500 MG tablet Take 500 mg by mouth 2 (two) times daily. 07/07/21   [provider]  losartan (COZAAR) 100 MG tablet Take 100 mg by mouth daily. 07/28/21   [provider]  polyethylene glycol (MIRALAX) 17 g packet Take 17 g by mouth daily. 08/27/21   Palumbo, April, MD      Allergies    Morphine and related    Review of Systems   Review of Systems  Constitutional:  Negative for chills, diaphoresis, fatigue and fever.  HENT:  Negative for  congestion, rhinorrhea and sneezing.   Eyes: Negative.   Respiratory:  Positive for cough. Negative for chest tightness and shortness of breath.   Cardiovascular:  Negative for chest pain and leg swelling.  Gastrointestinal:  Negative for abdominal pain, blood in stool, diarrhea, nausea and vomiting.  Genitourinary:  Negative for difficulty urinating, flank pain, frequency and hematuria.  Musculoskeletal:  Negative for arthralgias and back pain.  Skin:  Negative for rash.  Neurological:  Positive for headaches. Negative for dizziness, speech difficulty, weakness and numbness.    Physical Exam Updated Vital Signs BP (!) 159/91 (BP Location: Right Arm)   Pulse 74   Temp 98.3 F (36.8 C) (Oral)   Resp 16   Ht 5\' 4"  (1.626 m)   Wt 59 kg   SpO2 100%   BMI 22.31 kg/m  Physical Exam Constitutional:      Appearance: She is well-developed.  HENT:     Head: Normocephalic and atraumatic.  Eyes:     Pupils: Pupils are equal, round, and reactive to light.  Cardiovascular:     Rate and Rhythm: Normal rate and regular rhythm.     Heart sounds: Normal heart sounds.  Pulmonary:     Effort: Pulmonary effort is normal. No respiratory distress.     Breath sounds: Normal  breath sounds. No wheezing or rales.  Chest:     Chest wall: No tenderness.  Abdominal:     General: Bowel sounds are normal.     Palpations: Abdomen is soft.     Tenderness: There is no abdominal tenderness. There is no guarding or rebound.  Musculoskeletal:        General: Normal range of motion.     Cervical back: Normal range of motion and neck supple.  Lymphadenopathy:     Cervical: No cervical adenopathy.  Skin:    General: Skin is warm and dry.     Findings: No rash.  Neurological:     Mental Status: She is alert and oriented to person, place, and time.     ED Results / Procedures / Treatments   Labs (all labs ordered are listed, but only abnormal results are displayed) Labs Reviewed  RESP PANEL BY RT-PCR  (RSV, FLU A&B, COVID)  RVPGX2    EKG None  Radiology No results found.  Procedures Procedures    Medications Ordered in ED Medications - No data to display  ED Course/ Medical Decision Making/ A&P                             Medical Decision Making  Patient is a 52 year old who has minimal symptoms.  She has a [place] bit of a cough and a [place] bit of a headache but overall said she was just concerned because she had an exposure to COVID and wanted to be tested.  Her lungs are clear without clinical suggestions of pneumonia.  She has no hypoxia.  She is overall well-appearing.  Her COVID/flu test are negative.  Return precautions were given.  Final Clinical Impression(s) / ED Diagnoses Final diagnoses:  Acute cough    Rx / DC Orders ED Discharge Orders     None         Malvin Johns, MD 08/02/22 [phone]

## 2022-10-20 ENCOUNTER — Encounter (HOSPITAL_BASED_OUTPATIENT_CLINIC_OR_DEPARTMENT_OTHER): Payer: Self-pay | Admitting: Pediatrics

## 2022-10-20 ENCOUNTER — Other Ambulatory Visit: Payer: Self-pay

## 2022-10-20 ENCOUNTER — Emergency Department (HOSPITAL_BASED_OUTPATIENT_CLINIC_OR_DEPARTMENT_OTHER)
Admission: EM | Admit: 2022-10-20 | Discharge: 2022-10-20 | Disposition: A | Discharge status: Home / Self Care | Payer: Medicaid Other | Attending: Emergency Medicine | Admitting: Emergency Medicine

## 2022-10-20 ENCOUNTER — Emergency Department (HOSPITAL_BASED_OUTPATIENT_CLINIC_OR_DEPARTMENT_OTHER): Payer: Medicaid Other

## 2022-10-20 DIAGNOSIS — E119 Type 2 diabetes mellitus without complications: Secondary | ICD-10-CM | POA: Diagnosis not present

## 2022-10-20 DIAGNOSIS — I110 Hypertensive heart disease with heart failure: Secondary | ICD-10-CM | POA: Insufficient documentation

## 2022-10-20 DIAGNOSIS — Z8673 Personal history of transient ischemic attack (TIA), and cerebral infarction without residual deficits: Secondary | ICD-10-CM | POA: Diagnosis not present

## 2022-10-20 DIAGNOSIS — R0602 Shortness of breath: Secondary | ICD-10-CM | POA: Diagnosis not present

## 2022-10-20 DIAGNOSIS — D72829 Elevated white blood cell count, unspecified: Secondary | ICD-10-CM | POA: Diagnosis not present

## 2022-10-20 DIAGNOSIS — I509 Heart failure, unspecified: Secondary | ICD-10-CM | POA: Diagnosis not present

## 2022-10-20 LAB — CBC
HCT: 39.2 % (ref 36.0–46.0)
Hemoglobin: 13.2 g/dL (ref 12.0–15.0)
MCH: 29.1 pg (ref 26.0–34.0)
MCHC: 33.7 g/dL (ref 30.0–36.0)
MCV: 86.5 fL (ref 80.0–100.0)
Platelets: 237 10*3/uL (ref 150–400)
RBC: 4.53 MIL/uL (ref 3.87–5.11)
RDW: 11.4 % — ABNORMAL LOW (ref 11.5–15.5)
WBC: 15.6 10*3/uL — ABNORMAL HIGH (ref 4.0–10.5)
nRBC: 0 % (ref 0.0–0.2)

## 2022-10-20 LAB — BASIC METABOLIC PANEL
Anion gap: 9 (ref 5–15)
BUN: 26 mg/dL — ABNORMAL HIGH (ref 6–20)
CO2: 21 mmol/L — ABNORMAL LOW (ref 22–32)
Calcium: 8.3 mg/dL — ABNORMAL LOW (ref 8.9–10.3)
Chloride: 103 mmol/L (ref 98–111)
Creatinine, Ser: 1.86 mg/dL — ABNORMAL HIGH (ref 0.44–1.00)
GFR, Estimated: 32 mL/min — ABNORMAL LOW (ref >60)
Glucose, Bld: 143 mg/dL — ABNORMAL HIGH (ref 70–99)
Potassium: 3.7 mmol/L (ref 3.5–5.1)
Sodium: 133 mmol/L — ABNORMAL LOW (ref 135–145)

## 2022-10-20 NOTE — ED Notes (Signed)
Seen in lobby before triage, BBS diminished t/o.  R/A spO2 99%, RR 19,   10/20/22 1842  Respiratory Assessment  $ RT Protocol Assessment  Yes  Assessment Type Assess only  Respiratory Pattern Regular;Unlabored;Symmetrical;Dyspnea at rest  Chest Assessment Chest expansion symmetrical  Cough Congested  Bilateral Breath Sounds Clear;Diminished  Oxygen Therapy/Pulse Ox  O2 Therapy Room air  SpO2 99 %   speaking in completed sentences. Triage RN updated.

## 2022-10-20 NOTE — ED Triage Notes (Signed)
C/O shortness of breathe started while walking.

## 2022-10-20 NOTE — ED Notes (Signed)
Cannot discharge, registration in chart.   

## 2022-10-20 NOTE — Discharge Instructions (Signed)
You were seen in the emergency room today with shortness of breath.  I like for you to call your cardiology doctors tomorrow to discuss any changes they may like to make to your fluid medications.  Please continue your home medicines and return to the emergency department if develop chest pain, fever, other sudden/severe symptoms.

## 2022-10-20 NOTE — ED Provider Notes (Signed)
Emergency Department Provider Note   I have reviewed the triage vital signs and the nursing notes.   HISTORY  Chief Complaint Shortness of Breath   HPI Sharon May is a 52 y.o. female past history of hypertension, diabetes, congestive heart failure presents to the emergency department with increased dyspnea on exertion.  No chest pain, pressure, tightness.  No fevers or chills.  No productive cough.  She is been compliant with her home medications including increasing the isosorbide and Lasix.  Her cardiologist is monitoring her kidney function and titrating.  She called today due to feeling more short of breath when walking up 3 flights of stairs, where she lives, was advised to present to the emergency department.  She denies hanging onto much fluid in her legs or abdomen. No pleuritic pain.    Past Medical History:  Diagnosis Date   Diabetes mellitus    Hypertension    Seizures (HCC)    Stroke (HCC)     Review of Systems  Constitutional: No fever/chills Cardiovascular: Denies chest pain. Respiratory: Positive shortness of breath. Gastrointestinal: No abdominal pain.  No nausea, no vomiting.  No diarrhea.  No constipation. Genitourinary: Negative for dysuria. Musculoskeletal: Negative for back pain. Skin: Negative for rash. Neurological: Negative for headaches, focal weakness or numbness.  ____________________________________________   PHYSICAL EXAM:  VITAL SIGNS: ED Triage Vitals  Enc Vitals Group     BP 10/20/22 1855 (!) 151/84     Pulse Rate 10/20/22 1855 86     Resp 10/20/22 1855 18     Temp 10/20/22 1858 97.8 F (36.6 C)     Temp Source 10/20/22 1858 Temporal     SpO2 10/20/22 1842 99 %     Weight 10/20/22 1856 160 lb (72.6 kg)     Height 10/20/22 1856  (1.626 m)   Constitutional: Alert and oriented. Well appearing and in no acute distress. Eyes: Conjunctivae are normal.  Head: Atraumatic. Nose: No congestion/rhinnorhea. Mouth/Throat: Mucous  membranes are moist.  Neck: No stridor.   Cardiovascular: Normal rate, regular rhythm. Good peripheral circulation. Grossly normal heart sounds.   Respiratory: Normal respiratory effort.  No retractions. Lungs CTAB. Gastrointestinal: Soft and nontender. No distention.  Musculoskeletal: No lower extremity tenderness nor edema. No gross deformities of extremities. Neurologic:  Normal speech and language. No gross focal neurologic deficits are appreciated.  Skin:  Skin is warm, dry and intact. No rash noted.  ____________________________________________   LABS (all labs ordered are listed, but only abnormal results are displayed)  Labs Reviewed  BASIC METABOLIC PANEL - Abnormal; Notable for the following components:      Result Value   Sodium 133 (*)    CO2 21 (*)    Glucose, Bld 143 (*)    BUN 26 (*)    Creatinine, Ser 1.86 (*)    Calcium 8.3 (*)    GFR, Estimated 32 (*)    All other components within normal limits  CBC - Abnormal; Notable for the following components:   WBC 15.6 (*)    RDW 11.4 (*)    All other components within normal limits  PREGNANCY, URINE   ____________________________________________  EKG   EKG Interpretation  Date/Time:  Thursday October 20 2022 18:59:29 EDT Ventricular Rate:  85 PR Interval:  141 QRS Duration: 95 QT Interval:  369 QTC Calculation: 439 R Axis:   136 Text Interpretation: Sinus rhythm Right axis deviation Low voltage, extremity leads Probable anterolateral infarct, old Similar to 2019 tracing Confirmed  by Alona Bene [phone]) on 10/20/2022 7:28:54 PM        ____________________________________________  RADIOLOGY  DG Chest 2 View  Result Date: 10/20/2022 CLINICAL DATA:  Chest pain EXAM: CHEST - 2 VIEW COMPARISON:  CXR 11/10/21 FINDINGS: No pleural effusion. No pneumothorax. No focal airspace opacity. Normal cardiac and mediastinal contours. No radiographically apparent displaced rib fractures. Visualized upper abdomen is  unremarkable. Vertebral body heights are IMPRESSION: No focal airspace opacity. Electronically Signed   By: Lorenza Cambridge M.D.   On: 10/20/2022 19:36    ____________________________________________   PROCEDURES  Procedure(s) performed:   Procedures  None  ____________________________________________   INITIAL IMPRESSION / ASSESSMENT AND PLAN / ED COURSE  Pertinent labs & imaging results that were available during my care of the patient were reviewed by me and considered in my medical decision making (see chart for details).   This patient is Presenting for Evaluation of SOB, which does require a range of treatment options, and is a complaint that involves a high risk of morbidity and mortality.  The Differential Diagnoses includes but is not exclusive to acute coronary syndrome, aortic dissection, pulmonary embolism, cardiac tamponade, community-acquired pneumonia, pericarditis, musculoskeletal chest wall pain, etc.   I decided to review pertinent External Data, and in summary patient followed in the [facility] system by cardiology. Last appointment 4/22.   Clinical Laboratory Tests Ordered, included CBC with leukocytosis. No infection symptoms. Creatinine near baseline at 1.86. No AKI.   Radiologic Tests Ordered, included CXR. I independently interpreted the images and agree with radiology interpretation.   Cardiac Monitor Tracing which shows NSR.   Social Determinants of Health Risk denies any drugs or EtOH.   Medical Decision Making: Summary:  Patient presents emergency department with increased dyspnea on exertion.  No hypoxemia, increased work of breathing, chest tightness.  No fevers or chills to suspect infectious etiology.  Patient's lung x-ray is clear, lungs are clear.  Exceedingly low suspicion for PE although considered.  Plan to continue her home diuretic, which was recently increased by her cardiologist per note review. Will continue at this dose given her kidney  injury history rather than increasing dose in the ED. Patient to call Cardiology team in the AM to make them aware of ED visit.    Patient's presentation is most consistent with acute presentation with potential threat to life or bodily function.   Disposition: discharge  ____________________________________________  FINAL CLINICAL IMPRESSION(S) / ED DIAGNOSES  Final diagnoses:  SOB (shortness of breath)    Note:  This document was prepared using Dragon voice recognition software and may include unintentional dictation errors.  Alona Bene, MD, [facility]    Tonisha Silvey, Arlyss Repress, MD 10/20/22 2033

## 2022-10-20 NOTE — ED Notes (Signed)
Discharge paperwork reviewed entirely with patient, including follow up care. Pain was under control. No prescriptions were called in, but all questions were addressed.  Pt verbalized understanding as well as all parties involved. No questions or concerns voiced at the time of discharge. No acute distress noted.   Pt ambulated out to PVA without incident or assistance.
